# Patient Record
Sex: Female | Born: 1935 | Race: White | Hispanic: No | Smoking: Former smoker
Health system: Southern US, Community
[De-identification: ages and names within clinical notes are randomized; demographics above are authoritative.]

## PROBLEM LIST (undated history)

## (undated) DIAGNOSIS — M4310 Spondylolisthesis, site unspecified: Secondary | ICD-10-CM

## (undated) DIAGNOSIS — E785 Hyperlipidemia, unspecified: Secondary | ICD-10-CM

## (undated) DIAGNOSIS — E079 Disorder of thyroid, unspecified: Secondary | ICD-10-CM

## (undated) DIAGNOSIS — M810 Age-related osteoporosis without current pathological fracture: Secondary | ICD-10-CM

## (undated) DIAGNOSIS — M1990 Unspecified osteoarthritis, unspecified site: Secondary | ICD-10-CM

## (undated) DIAGNOSIS — I10 Essential (primary) hypertension: Secondary | ICD-10-CM

## (undated) DIAGNOSIS — F0390 Unspecified dementia without behavioral disturbance: Secondary | ICD-10-CM

## (undated) DIAGNOSIS — F419 Anxiety disorder, unspecified: Secondary | ICD-10-CM

## (undated) DIAGNOSIS — F32A Depression, unspecified: Secondary | ICD-10-CM

## (undated) DIAGNOSIS — J449 Chronic obstructive pulmonary disease, unspecified: Secondary | ICD-10-CM

## (undated) DIAGNOSIS — F329 Major depressive disorder, single episode, unspecified: Secondary | ICD-10-CM

## (undated) DIAGNOSIS — I639 Cerebral infarction, unspecified: Secondary | ICD-10-CM

## (undated) HISTORY — DX: Anxiety disorder, unspecified: F41.9

## (undated) HISTORY — DX: Essential (primary) hypertension: I10

## (undated) HISTORY — PX: THYROID SURGERY: SHX805

## (undated) HISTORY — DX: Depression, unspecified: F32.A

## (undated) HISTORY — DX: Age-related osteoporosis without current pathological fracture: M81.0

## (undated) HISTORY — DX: Disorder of thyroid, unspecified: E07.9

## (undated) HISTORY — DX: Hyperlipidemia, unspecified: E78.5

## (undated) HISTORY — DX: Unspecified osteoarthritis, unspecified site: M19.90

## (undated) HISTORY — PX: EYE SURGERY: SHX253

## (undated) HISTORY — PX: FOOT SURGERY: SHX648

## (undated) HISTORY — DX: Major depressive disorder, single episode, unspecified: F32.9

## (undated) HISTORY — DX: Chronic obstructive pulmonary disease, unspecified: J44.9

## (undated) HISTORY — PX: EXCISION OF ADNEXAL MASS: SHX5820

## (undated) HISTORY — DX: Spondylolisthesis, site unspecified: M43.10

---

## 1977-04-05 DIAGNOSIS — E079 Disorder of thyroid, unspecified: Secondary | ICD-10-CM

## 1977-04-05 HISTORY — PX: APPENDECTOMY: SHX54

## 1977-04-05 HISTORY — DX: Disorder of thyroid, unspecified: E07.9

## 1995-04-06 HISTORY — PX: ABDOMINAL HYSTERECTOMY: SHX81

## 2000-04-05 HISTORY — PX: CHOLECYSTECTOMY: SHX55

## 2004-02-24 ENCOUNTER — Ambulatory Visit: Payer: Self-pay | Admitting: Internal Medicine

## 2004-06-17 ENCOUNTER — Ambulatory Visit: Payer: Self-pay | Admitting: Gastroenterology

## 2004-07-15 ENCOUNTER — Ambulatory Visit: Payer: Self-pay | Admitting: Internal Medicine

## 2004-09-02 ENCOUNTER — Ambulatory Visit: Payer: Self-pay | Admitting: Internal Medicine

## 2005-03-10 ENCOUNTER — Ambulatory Visit: Payer: Self-pay | Admitting: Internal Medicine

## 2005-09-22 ENCOUNTER — Ambulatory Visit: Payer: Self-pay | Admitting: Internal Medicine

## 2005-11-17 ENCOUNTER — Other Ambulatory Visit: Payer: Self-pay

## 2005-12-14 ENCOUNTER — Ambulatory Visit: Payer: Self-pay | Admitting: Obstetrics and Gynecology

## 2005-12-15 ENCOUNTER — Ambulatory Visit: Payer: Self-pay | Admitting: Obstetrics and Gynecology

## 2005-12-20 ENCOUNTER — Ambulatory Visit: Payer: Self-pay | Admitting: Obstetrics and Gynecology

## 2006-03-15 ENCOUNTER — Ambulatory Visit: Payer: Self-pay | Admitting: Internal Medicine

## 2006-11-29 ENCOUNTER — Ambulatory Visit: Payer: Self-pay | Admitting: Specialist

## 2007-01-16 ENCOUNTER — Ambulatory Visit: Payer: Self-pay | Admitting: Podiatry

## 2007-01-18 ENCOUNTER — Ambulatory Visit: Payer: Self-pay | Admitting: Internal Medicine

## 2007-01-20 ENCOUNTER — Ambulatory Visit: Payer: Self-pay | Admitting: Podiatry

## 2007-06-18 LAB — HM COLONOSCOPY

## 2007-07-10 ENCOUNTER — Ambulatory Visit: Payer: Self-pay | Admitting: Internal Medicine

## 2007-07-11 ENCOUNTER — Ambulatory Visit: Payer: Self-pay | Admitting: Internal Medicine

## 2008-01-02 ENCOUNTER — Ambulatory Visit: Payer: Self-pay | Admitting: Internal Medicine

## 2008-03-11 ENCOUNTER — Ambulatory Visit: Payer: Self-pay | Admitting: Internal Medicine

## 2008-03-11 LAB — HM MAMMOGRAPHY

## 2011-02-16 ENCOUNTER — Ambulatory Visit: Payer: Self-pay | Admitting: Internal Medicine

## 2011-12-29 ENCOUNTER — Emergency Department: Payer: Self-pay | Admitting: Emergency Medicine

## 2012-02-09 ENCOUNTER — Telehealth: Payer: Self-pay | Admitting: Internal Medicine

## 2012-02-09 NOTE — Telephone Encounter (Signed)
If abdominal pain - I rec eval at acute care to confirm nothing acute going on.  To acute care today.

## 2012-02-09 NOTE — Telephone Encounter (Signed)
Pt's son called and is saying his mother is having really bad sharp pains in stomach///no vomiting. He wasn't sure what to do or what was going on.

## 2012-02-09 NOTE — Telephone Encounter (Signed)
Called patient at home, know one answered and voice mail was full. Will call patient back later.

## 2012-02-09 NOTE — Telephone Encounter (Signed)
Call back and spoke to pt son. He will seek care at acute. Will follow-up after.

## 2012-02-11 ENCOUNTER — Other Ambulatory Visit: Payer: Self-pay | Admitting: *Deleted

## 2012-02-21 ENCOUNTER — Other Ambulatory Visit: Payer: Self-pay | Admitting: *Deleted

## 2012-02-21 MED ORDER — PRAVASTATIN SODIUM 10 MG PO TABS: 10.0000 mg | ORAL_TABLET | Freq: Every day | ORAL | Status: DC

## 2012-03-31 ENCOUNTER — Ambulatory Visit: Payer: Self-pay | Admitting: Medical

## 2012-03-31 ENCOUNTER — Emergency Department: Payer: Self-pay | Admitting: Emergency Medicine

## 2012-05-22 ENCOUNTER — Ambulatory Visit: Payer: Self-pay | Admitting: Internal Medicine

## 2012-05-22 ENCOUNTER — Encounter: Payer: Self-pay | Admitting: *Deleted

## 2012-08-04 ENCOUNTER — Ambulatory Visit: Payer: Self-pay | Admitting: Physician Assistant

## 2012-10-09 ENCOUNTER — Observation Stay: Payer: Self-pay | Admitting: Internal Medicine

## 2012-10-09 LAB — CBC
HCT: 42.8 % (ref 35.0–47.0)
HGB: 14.3 g/dL (ref 12.0–16.0)
MCH: 32.3 pg (ref 26.0–34.0)
MCV: 96 fL (ref 80–100)

## 2012-10-09 LAB — COMPREHENSIVE METABOLIC PANEL
Alkaline Phosphatase: 72 U/L (ref 50–136)
Bilirubin,Total: 0.3 mg/dL (ref 0.2–1.0)
Calcium, Total: 8.6 mg/dL (ref 8.5–10.1)
Co2: 27 mmol/L (ref 21–32)
Creatinine: 0.84 mg/dL (ref 0.60–1.30)
EGFR (African American): >60

## 2012-10-09 LAB — TROPONIN I: Troponin-I: <0.02 ng/mL

## 2012-10-10 ENCOUNTER — Ambulatory Visit: Payer: Self-pay | Admitting: Neurology

## 2012-10-10 LAB — URINALYSIS, COMPLETE
Blood: NEGATIVE
Ketone: NEGATIVE
Protein: NEGATIVE
Specific Gravity: 1.012 (ref 1.003–1.030)
Squamous Epithelial: <1
WBC UR: 2 /HPF (ref 0–5)

## 2012-10-11 DIAGNOSIS — I359 Nonrheumatic aortic valve disorder, unspecified: Secondary | ICD-10-CM

## 2012-11-13 ENCOUNTER — Ambulatory Visit: Payer: Self-pay | Admitting: Internal Medicine

## 2013-01-10 ENCOUNTER — Ambulatory Visit: Payer: Self-pay | Admitting: Ophthalmology

## 2013-03-07 ENCOUNTER — Ambulatory Visit: Payer: Self-pay | Admitting: Ophthalmology

## 2013-12-28 ENCOUNTER — Ambulatory Visit: Payer: Self-pay | Admitting: Family Medicine

## 2014-04-24 DIAGNOSIS — M810 Age-related osteoporosis without current pathological fracture: Secondary | ICD-10-CM | POA: Diagnosis not present

## 2014-04-24 DIAGNOSIS — B351 Tinea unguium: Secondary | ICD-10-CM | POA: Diagnosis not present

## 2014-04-24 DIAGNOSIS — Z720 Tobacco use: Secondary | ICD-10-CM | POA: Diagnosis not present

## 2014-04-24 DIAGNOSIS — Z1239 Encounter for other screening for malignant neoplasm of breast: Secondary | ICD-10-CM | POA: Diagnosis not present

## 2014-04-24 DIAGNOSIS — F0390 Unspecified dementia without behavioral disturbance: Secondary | ICD-10-CM | POA: Diagnosis not present

## 2014-04-24 DIAGNOSIS — I10 Essential (primary) hypertension: Secondary | ICD-10-CM | POA: Diagnosis not present

## 2014-04-24 DIAGNOSIS — M79674 Pain in right toe(s): Secondary | ICD-10-CM | POA: Diagnosis not present

## 2014-04-24 DIAGNOSIS — J449 Chronic obstructive pulmonary disease, unspecified: Secondary | ICD-10-CM | POA: Diagnosis not present

## 2014-04-24 DIAGNOSIS — L97511 Non-pressure chronic ulcer of other part of right foot limited to breakdown of skin: Secondary | ICD-10-CM | POA: Diagnosis not present

## 2014-04-24 DIAGNOSIS — M79675 Pain in left toe(s): Secondary | ICD-10-CM | POA: Diagnosis not present

## 2014-04-24 DIAGNOSIS — M1990 Unspecified osteoarthritis, unspecified site: Secondary | ICD-10-CM | POA: Diagnosis not present

## 2014-06-27 DIAGNOSIS — G8929 Other chronic pain: Secondary | ICD-10-CM | POA: Diagnosis not present

## 2014-06-27 DIAGNOSIS — M65342 Trigger finger, left ring finger: Secondary | ICD-10-CM | POA: Diagnosis not present

## 2014-06-27 DIAGNOSIS — M79642 Pain in left hand: Secondary | ICD-10-CM | POA: Diagnosis not present

## 2014-07-26 NOTE — Discharge Summary (Signed)
PATIENT NAME:  Allison Sullivan, Chey M MR#:  [id number] DATE OF BIRTH:  1931-02-20  DATE OF ADMISSION:  10/09/2012 DATE OF DISCHARGE:  10/11/2012  PRIMARY CARE PHYSICIAN: Clydie Braunavid Fitzgerald, MD  CONSULTING NEUROLOGIST: Pauletta BrownsYuriy Zeylikman, MD     DISCHARGE DIAGNOSES:  1.  Transient ischemic attack. 2.  Hypertension.  3.  Hyperlipidemia.  4.  Dementia.    HISTORY OF PRESENT ILLNESS: This is an 81 year old female with a history of hypertension, hyperlipidemia and dementia who presented with blurred vision, severe weakness and right eye gaze palsy and generalized weakness. In the ED, she was noted to have a right eye abduction abnormality. CT scan of the head was negative. She was admitted for further workup.   HOSPITAL COURSE BY ISSUE:  1.  Eye palsy and generalized weakness as well as acute mental status changes:  This was felt to likely be an acute CVA with possible intranuclear ophthalmoplegia. However, she had an MRI done that was negative.  She also had echocardiograms, carotid Dopplers, all of which were unrevealing. She was started on aspirin and continued on a statin. Neurology did see her.  2.  Hypertension:  Blood pressure was well controlled.  3.  Hyperlipidemia:  The patient was discharged on a statin.   DISCHARGE MEDICATIONS: 1.  Vitamin B12 once a day.  2.  Calcium with vitamin D once a day.  3.  Simvastatin 40 mg once a day.  4.  Aspirin 81 mg once a day.  5.  Aricept 10 mg once a day.   DISPOSITION: Discharged to home with home health for physical therapy, nursing, social work.   DISCHARGE DIET: Low sodium, regular consistency.   ACTIVITY: As tolerated.   DISCHARGE FOLLOWUP:  The patient will follow up with Dr. Sampson GoonFitzgerald in 1 to 2 weeks.  TIME TAKEN:  This discharge took 35 minutes.   ____________________________ Stann Mainlandavid P. Sampson GoonFitzgerald, MD dpf:cb D: 10/18/2012 21:42:24 ET T: 10/18/2012 23:15:16 ET JOB#: [id number]  cc: Stann Mainlandavid P. Sampson GoonFitzgerald, MD, <Dictator> DAVID Sampson GoonFITZGERALD  MD ELECTRONICALLY SIGNED 10/24/2012 22:27

## 2014-07-26 NOTE — H&P (Signed)
PATIENT NAME:  Allison Sullivan, Allison Sullivan MR#:  [id number] DATE OF BIRTH:  May 07, 1930  DATE OF ADMISSION:  10/09/2012  PRIMARY CARE PHYSICIAN: Dr. Sampson Goon   CHIEF COMPLAINT: Blurred vision and right-sided eye deviation.   HISTORY OF PRESENTING ILLNESS: An 81 year old female patient with history of hypertension, hyperlipidemia, dementia, presents to the hospital brought in by the daughter after she noticed that while sitting on the porch the patient complained of a sudden onset of blurred vision. Later, she noticed that her right eye was deviated outside, with severe generalized weakness; she was unable to stand, and daughter brought her to the Emergency Room. Here, the patient's motor strength is 5 out of 5 all over but has right eye gaze palsy with the eye abducted outside. The patient does not complain of any diplopia. No blurred vision. No nausea, vomiting.  Complains of on and off headache which is not present at this time.   The patient has had recurrent episodes of presyncopal episodes in the past which were worked up in the past and nothing found. Today, there was no presyncope or fall, uses a cane for ambulation.   PAST MEDICAL HISTORY:  Hypertension, hyperlipidemia, COPD, dementia.   SOCIAL HISTORY: The patient continues to smoke. No alcohol. No illicit drugs.   CODE STATUS:  FULL CODE.    REVIEW OF SYSTEMS:  CONSTITUTIONAL: No fatigue, weakness.  EYES: No blurred vision, pain, redness.  Had blurred vision previously which has resolved.  ENT: No tinnitus, ear pain, hearing loss.  RESPIRATORY: No cough, wheeze, hemoptysis.  CARDIOVASCULAR: No chest pain, orthopnea, edema.  GASTROINTESTINAL: No nausea, vomiting, diarrhea, abdominal pain.  GENITOURINARY: No dysuria, hematuria, frequency.  ENDOCRINE: No polyuria, nocturia or thyroid problems.  HEMATOLOGIC/LYMPHATIC: No anemia, easy bruising, bleeding.  INTEGUMENTARY: No acne, rash, lesions.  MUSCULOSKELETAL: Has arthritis.  NEUROLOGICAL: Has  a right eye gaze palsy.  PSYCHIATRIC: No anxiety or depression.   FAMILY HISTORY: Reviewed, but unknown.   HOME MEDICATIONS: Calcium and vitamin D 1 tablet oral once a day, donepezil 5 mg 2 tablets oral once a day, vitamin B12 oral once a day.   ALLERGIES: FOSAMAX.   PHYSICAL EXAMINATION: VITAL SIGNS: Show a temperature of 98.1, pulse of 78, blood pressure 167/69, saturating 96% on room air.  GENERAL: Frail, elderly Caucasian female patient lying in bed, comfortable, conversational.  PSYCHIATRIC: Alert, oriented x 3. Mood and affect appropriate. Judgment intact, pleasant.  HEENT: Atraumatic, normocephalic. Oral mucosa moist and pink. External ears and nose normal. Right eye abducted laterally.  NECK: Supple. No thyromegaly. No palpable lymph nodes. Trachea midline. No carotid bruit, JVD.  CARDIOVASCULAR: S1, S2 without any murmurs. Peripheral pulses 2+. No edema.  RESPIRATORY: Normal work of breathing. Clear to auscultation on both sides.  GASTROINTESTINAL: Soft abdomen, nontender. Bowel sounds present. No hepatosplenomegaly palpable.  SKIN: Warm and dry. No petechiae, rash, ulcers.  MUSCULOSKELETAL: No joint swelling, redness, effusion of the large joints. Normal muscle tone.  NEUROLOGICAL: Motor strength 5 out of 5 in upper and lower extremities. Sensation to fine touch is intact all over.  Reflexes 2+.  LYMPHATIC: No cervical, supraclavicular lymphadenopathy.   LABORATORY AND RADIOLOGICAL DATA:  Glucose 165, BUN 13, creatinine 0.84, sodium 142, potassium 3.6. AST, ALT, alkaline phosphatase normal. Troponin less than 0.02. WBC 8.9, hemoglobin 14.3, platelets of 203.  CT scan of the head shows atrophy, microvascular changes  EKG shows normal sinus rhythm. Left anterior fascicular block.   ASSESSMENT AND PLAN: 1.  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 Will admit the patient onto a tele floor. We will  get an MRI of the brain, echo, carotids.  Start her on aspirin, statin.  Get a neurology consult. The patient does not have any problems with dysphagia or weakness. Will not need speech or PT. We will check a fasting lipid profile in the morning. The patient does have elevated blood pressure. Could be secondary to her acute stroke. The patient has history of hypertension per old records but presently not on blood pressure medications.  Can be restarted on blood pressure medications once we can confirm she has an acute CVA or not.  2.  Hypertension:  See above.  3.  Hyperlipidemia:  On statin.  4.  Dementia.  5.  Deep vein thrombosis prophylaxis with Lovenox.   CODE STATUS:  FULL CODE.     TIME SPENT TODAY ON THIS CASE: 60 minutes.  ____________________________ Molinda BailiffSrikar R. Anniah Glick, MD srs:cb D: 10/09/2012 19:49:50 ET T: 10/09/2012 20:03:40 ET JOB#: [id number]  cc: Wardell HeathSrikar R. Lisha Vitale, MD, <Dictator> Dr. Redmond SchoolFitzgerald Dayana Dalporto R Kaulana Brindle MD ELECTRONICALLY SIGNED 10/22/2012 23:35

## 2014-07-26 NOTE — Consult Note (Signed)
PATIENT NAME:  Allison Sullivan, GROSECLOSE MR#:  [id number] DATE OF BIRTH:  [birth year]  DATE OF CONSULTATION:  10/10/2012  CONSULTING PHYSICIAN:  Pauletta Browns, MD  REASON FOR CONSULTATION:  Right eye deviation and blurry vision.  Information is obtained from chart, the patient's daughter who is at bedside and the patient herself.   HISTORY OF PRESENT ILLNESS:  This is an 81 year old female with past medical history of hypertension, hyperlipidemia, dementia, presents to the hospital by the daughter after she noticed her mom complaining of blurry vision and difficulty moving her right eye.  Her right eye was described as being down and out, suspected to be consistent with third nerve palsy on the right.  The patient was complaining of blurry vision and could not focus on the TV. The patient's daughter also states the patient was swaying from side to side and had weakness in the lower extremities. It is unclear whether one leg was weaker than the other.  As per daughter, the patient has had numerous falls in the past, more so in the past few months.  Currently, the patient is back to her baseline, has no complaints.   PAST MEDICAL HISTORY:  Significant for hypertension, hyperlipidemia, COPD and dementia.   SOCIAL HISTORY:  The patient continues to smoke.  No alcohol use.  No illicit drug use.  CODE STATUS:  FULL.  REVIEW OF SYSTEMS:  EYES:  No blurred vision.  No pain.  No redness. Had some blurred vision, but has resolved.  ENT:  No tinnitus.  RESPIRATORY:  No cough.  No wheezing.  No hemoptysis.  CARDIOVASCULAR:  No chest pain, orthopnea or edema.  GASTROINTESTINAL:  No nausea.  No vomiting, diarrhea.  GENITOURINARY:  No dysuria or hematuria.  ENDOCRINE:  No polyuria or nocturia. HEMATOLOGIC:  No anemia or bruising complications.  NEUROLOGICAL:  Has history of dementia.   HOME MEDICATIONS:  Including calcium, donepezil, vitamin B12 complex.  IMAGING:  Status post MRI of the brain did not show any  acute intracranial pathology.  Carotid Doppler:  No evidence of hemodynamic stenosis.   VITAL SIGNS:  Include a temperature of 97.8, pulse 60, respirations 18, blood pressure 132/65.   PHYSICAL EXAMINATION:  The patient is alert to her name and to the type of facility it is. She states this is the hospital, could not tell me the name of the facility, could not tell me the date, time or the President of the [place]. Speech appears to be intact. No signs of dysarthria or aphasia.  CRANIAL NERVE EXAMINATION:  Pupils are equal, round and symmetrical, 4 mm, 2 mm reactive bilaterally. Visual fields appear to be intact. Pupils are midline. Facial sensation intact. Facial motor is intact. Tongue is midline. Uvula elevates symmetrically. Shoulder shrug intact. Motor 5 out of 5 bilateral upper and lower extremities.  Reflexes are 1+ throughout.  Coordination: Finger-to-nose intact.  Gait could not be assessed.   IMPRESSION: An 81 year old female with past medical history of hypertension, hyperlipidemia, presenting with sudden onset of blurred vision and what appears to be right third nerve palsy, as well as ataxic gait and generalized weakness in the lower extremities.  All these symptoms have resolved.   PLAN: Suspicion for TIA that is specifically to the brainstem, more or so of the midbrain.  MRI of the brain reviewed and carotid Doppler without any acute intracranial pathology and no hemodynamic stenosis. Would start patient on aspirin 325 daily, obtain 2D echocardiogram, have physical therapy and occupational therapy see  her.  The patient should also be sent for hemoglobin A1c,  lipid panel, statin should be started.  This case was discussed with daughter in detail. After the above workup is complete, I feel safe the patient can be discharged from a neurological standpoint if she is back to her baseline.  Thank you for allowing us to see this patient.   ____________________________ Pauletta BrownsYuriy  Haroun Cotham, MD yz:ce D: 10/10/2012 12:07:22 ET T: 10/10/2012 12:26:14 ET JOB#: [id number]  cc: Pauletta BrownsYuriy Mohsen Odenthal, MD, <Dictator> Pauletta BrownsYURIY Jaystin Mcgarvey MD ELECTRONICALLY SIGNED 11/13/2012 14:49

## 2014-08-23 DIAGNOSIS — M79674 Pain in right toe(s): Secondary | ICD-10-CM | POA: Diagnosis not present

## 2014-08-23 DIAGNOSIS — I10 Essential (primary) hypertension: Secondary | ICD-10-CM | POA: Diagnosis not present

## 2014-08-23 DIAGNOSIS — F0280 Dementia in other diseases classified elsewhere without behavioral disturbance: Secondary | ICD-10-CM | POA: Diagnosis not present

## 2014-08-23 DIAGNOSIS — G301 Alzheimer's disease with late onset: Secondary | ICD-10-CM | POA: Diagnosis not present

## 2014-08-23 DIAGNOSIS — M79675 Pain in left toe(s): Secondary | ICD-10-CM | POA: Diagnosis not present

## 2014-08-23 DIAGNOSIS — B351 Tinea unguium: Secondary | ICD-10-CM | POA: Diagnosis not present

## 2014-08-23 DIAGNOSIS — M65342 Trigger finger, left ring finger: Secondary | ICD-10-CM | POA: Diagnosis not present

## 2014-08-23 DIAGNOSIS — J449 Chronic obstructive pulmonary disease, unspecified: Secondary | ICD-10-CM | POA: Diagnosis not present

## 2014-09-11 ENCOUNTER — Ambulatory Visit: Payer: Commercial Managed Care - HMO | Admitting: Anesthesiology

## 2014-09-11 ENCOUNTER — Encounter
Admission: RE | Disposition: A | Discharge status: Home / Self Care | Payer: Self-pay | Source: Ambulatory Visit | Attending: Surgery

## 2014-09-11 ENCOUNTER — Encounter: Payer: Self-pay | Admitting: *Deleted

## 2014-09-11 ENCOUNTER — Ambulatory Visit
Admission: RE | Admit: 2014-09-11 | Discharge: 2014-09-11 | Disposition: A | Discharge status: Home / Self Care | Payer: Commercial Managed Care - HMO | Source: Ambulatory Visit | Attending: Surgery | Admitting: Surgery

## 2014-09-11 DIAGNOSIS — Z9049 Acquired absence of other specified parts of digestive tract: Secondary | ICD-10-CM | POA: Diagnosis not present

## 2014-09-11 DIAGNOSIS — F17210 Nicotine dependence, cigarettes, uncomplicated: Secondary | ICD-10-CM | POA: Diagnosis not present

## 2014-09-11 DIAGNOSIS — Z7951 Long term (current) use of inhaled steroids: Secondary | ICD-10-CM | POA: Insufficient documentation

## 2014-09-11 DIAGNOSIS — J449 Chronic obstructive pulmonary disease, unspecified: Secondary | ICD-10-CM | POA: Insufficient documentation

## 2014-09-11 DIAGNOSIS — F17200 Nicotine dependence, unspecified, uncomplicated: Secondary | ICD-10-CM | POA: Diagnosis not present

## 2014-09-11 DIAGNOSIS — Z79899 Other long term (current) drug therapy: Secondary | ICD-10-CM | POA: Diagnosis not present

## 2014-09-11 DIAGNOSIS — Z90710 Acquired absence of both cervix and uterus: Secondary | ICD-10-CM | POA: Diagnosis not present

## 2014-09-11 DIAGNOSIS — B351 Tinea unguium: Secondary | ICD-10-CM | POA: Insufficient documentation

## 2014-09-11 DIAGNOSIS — Z809 Family history of malignant neoplasm, unspecified: Secondary | ICD-10-CM | POA: Insufficient documentation

## 2014-09-11 DIAGNOSIS — Z7982 Long term (current) use of aspirin: Secondary | ICD-10-CM | POA: Diagnosis not present

## 2014-09-11 DIAGNOSIS — I10 Essential (primary) hypertension: Secondary | ICD-10-CM | POA: Diagnosis not present

## 2014-09-11 DIAGNOSIS — M65342 Trigger finger, left ring finger: Secondary | ICD-10-CM | POA: Diagnosis not present

## 2014-09-11 DIAGNOSIS — Z823 Family history of stroke: Secondary | ICD-10-CM | POA: Insufficient documentation

## 2014-09-11 DIAGNOSIS — E780 Pure hypercholesterolemia: Secondary | ICD-10-CM | POA: Diagnosis not present

## 2014-09-11 DIAGNOSIS — M810 Age-related osteoporosis without current pathological fracture: Secondary | ICD-10-CM | POA: Diagnosis not present

## 2014-09-11 DIAGNOSIS — Z8249 Family history of ischemic heart disease and other diseases of the circulatory system: Secondary | ICD-10-CM | POA: Diagnosis not present

## 2014-09-11 DIAGNOSIS — F0390 Unspecified dementia without behavioral disturbance: Secondary | ICD-10-CM | POA: Diagnosis not present

## 2014-09-11 HISTORY — DX: Unspecified dementia, unspecified severity, without behavioral disturbance, psychotic disturbance, mood disturbance, and anxiety: F03.90

## 2014-09-11 HISTORY — PX: TRIGGER FINGER RELEASE: SHX641

## 2014-09-11 HISTORY — DX: Cerebral infarction, unspecified: I63.9

## 2014-09-11 SURGERY — RELEASE, A1 PULLEY, FOR TRIGGER FINGER
Anesthesia: Monitor Anesthesia Care | Laterality: Left | Wound class: Clean

## 2014-09-11 MED ORDER — PROPOFOL INFUSION 10 MG/ML OPTIME
INTRAVENOUS | Status: DC | PRN
  Administered 2014-09-11: 50 ug/kg/min via INTRAVENOUS

## 2014-09-11 MED ORDER — BUPIVACAINE HCL (PF) 0.5 % IJ SOLN
INTRAMUSCULAR | Status: DC | PRN
  Administered 2014-09-11: 10 mL

## 2014-09-11 MED ORDER — HYDROCODONE-ACETAMINOPHEN 5-325 MG PO TABS: 1.0000 | ORAL_TABLET | ORAL | Status: DC | PRN

## 2014-09-11 MED ORDER — CEFAZOLIN SODIUM 1-5 GM-% IV SOLN
1.0000 g | Freq: Once | INTRAVENOUS | Status: AC
  Administered 2014-09-11: 1 g via INTRAVENOUS

## 2014-09-11 MED ORDER — LACTATED RINGERS IV SOLN
INTRAVENOUS | Status: DC
  Administered 2014-09-11: 13:00:00 via INTRAVENOUS

## 2014-09-11 MED ORDER — MIDAZOLAM HCL 5 MG/5ML IJ SOLN
INTRAMUSCULAR | Status: DC | PRN
  Administered 2014-09-11: 1 mg via INTRAVENOUS

## 2014-09-11 MED ORDER — FENTANYL CITRATE (PF) 100 MCG/2ML IJ SOLN
INTRAMUSCULAR | Status: DC | PRN
  Administered 2014-09-11: 50 ug via INTRAVENOUS

## 2014-09-11 SURGICAL SUPPLY — 29 items
BANDAGE ELASTIC 2 VELCRO NS LF (GAUZE/BANDAGES/DRESSINGS) IMPLANT
BANDAGE ELASTIC 3 VELCRO NS (GAUZE/BANDAGES/DRESSINGS) IMPLANT
BANDAGE ELASTIC 4 VELCRO NS (GAUZE/BANDAGES/DRESSINGS) IMPLANT
BNDG COHESIVE 4X5 TAN STRL (GAUZE/BANDAGES/DRESSINGS) ×3 IMPLANT
BNDG ESMARK 4X12 TAN STRL LF (GAUZE/BANDAGES/DRESSINGS) ×3 IMPLANT
CHLORAPREP W/TINT 26ML (MISCELLANEOUS) ×3 IMPLANT
CORD BIP STRL DISP 12FT (MISCELLANEOUS) ×3 IMPLANT
COVER LIGHT HANDLE FLEXIBLE (MISCELLANEOUS) ×3 IMPLANT
COVER LIGHT HANDLE UNIVERSAL (MISCELLANEOUS) ×6 IMPLANT
CUFF TOURN SGL QUICK 18 (TOURNIQUET CUFF) IMPLANT
CUFF TOURNIQUET DUAL PORT 18X3 (MISCELLANEOUS) ×3 IMPLANT
GAUZE PETRO XEROFOAM 1X8 (MISCELLANEOUS) ×3 IMPLANT
GAUZE SPONGE 4X4 12PLY STRL (GAUZE/BANDAGES/DRESSINGS) ×3 IMPLANT
GAUZE STRETCH 2X75IN STRL (MISCELLANEOUS) IMPLANT
GLOVE BIO SURGEON STRL SZ8 (GLOVE) ×6 IMPLANT
GLOVE INDICATOR 8.0 STRL GRN (GLOVE) ×3 IMPLANT
GOWN STRL REUS W/ TWL LRG LVL3 (GOWN DISPOSABLE) ×1 IMPLANT
GOWN STRL REUS W/ TWL XL LVL3 (GOWN DISPOSABLE) ×1 IMPLANT
GOWN STRL REUS W/TWL LRG LVL3 (GOWN DISPOSABLE) ×2
GOWN STRL REUS W/TWL XL LVL3 (GOWN DISPOSABLE) ×2
NS IRRIG 500ML POUR BTL (IV SOLUTION) ×3 IMPLANT
PACK EXTREMITY ARMC (MISCELLANEOUS) ×3 IMPLANT
PAD GROUND ADULT SPLIT (MISCELLANEOUS) ×3 IMPLANT
STOCKINETTE ×3 IMPLANT
STOCKINETTE IMPERVIOUS LG (DRAPES) ×3 IMPLANT
STRAP BODY AND KNEE 60X3 (MISCELLANEOUS) ×3 IMPLANT
SUT PROLENE 4 0 PS 2 18 (SUTURE) ×3 IMPLANT
SUT VIC AB 3-0 SH 27 (SUTURE)
SUT VIC AB 3-0 SH 27X BRD (SUTURE) IMPLANT

## 2014-09-11 NOTE — Anesthesia Preprocedure Evaluation (Addendum)
Anesthesia Evaluation  Patient identified by MRN, date of birth, ID band Patient awake    Reviewed: Allergy & Precautions, H&P , NPO status , Patient's Chart, lab work & pertinent test results, reviewed documented beta blocker date and time   Airway Mallampati: II  TM Distance: >3 FB Neck ROM: full    Dental no notable dental hx.    Pulmonary COPD COPD inhaler, Current Smoker,  breath sounds clear to auscultation  Pulmonary exam normal       Cardiovascular Exercise Tolerance: Good hypertension, Rhythm:regular Rate:Normal     Neuro/Psych PSYCHIATRIC DISORDERS CVA    GI/Hepatic negative GI ROS, Neg liver ROS,   Endo/Other  negative endocrine ROS  Renal/GU negative Renal ROS  negative genitourinary   Musculoskeletal   Abdominal   Peds  Hematology negative hematology ROS (+)   Anesthesia Other Findings   Reproductive/Obstetrics negative OB ROS                           Anesthesia Physical Anesthesia Plan  ASA: III  Anesthesia Plan: Bier Block and MAC   Post-op Pain Management:    Induction:   Airway Management Planned:   Additional Equipment:   Intra-op Plan:   Post-operative Plan:   Informed Consent: I have reviewed the patients History and Physical, chart, labs and discussed the procedure including the risks, benefits and alternatives for the proposed anesthesia with the patient or authorized representative who has indicated his/her understanding and acceptance.   Dental Advisory Given  Plan Discussed with: CRNA  Anesthesia Plan Comments:       Anesthesia Quick Evaluation

## 2014-09-11 NOTE — Op Note (Signed)
09/11/2014  1:55 PM  Patient:   Allison Sullivan  Pre-Op Diagnosis:   Left ring trigger finger  Post-Op Diagnosis:   Same  Procedure:   Release left ring trigger finger  Surgeon:   Maryagnes AmosJ. Jeffrey Poggi, MD  Assistant:   Manson PasseyMcGhee, PA-C  Anesthesia:   Bier block  Findings:   As above.  Complications:   None  EBL:   0 cc  Fluids:   550 cc crystalloid  TT:   38 minutes at 250 mmHg  Drains:   None  Closure:   4-0 Prolene  Implants:   None  Brief Clinical Note:   The patient is an 81 year old female with a history of progressively worsening pain and stiffness of her left ring finger. Her history and examination are consistent with a left ring trigger finger with the inability to extend her finger. She presents at this time for definitive management of her injury.  Procedure:   The patient was brought into the operating room and lain in the supine position. After adequate IV sedation was achieved, a Bier block was placed and the tourniquet inflated to 250 mmHg. The left hand and upper extremity were prepped with DuraPrep solution before being draped sterilely. Preoperative antibiotics were administered. The finger was gently extended and was able to be brought out to near 0. The patient was positioned in a lead hand before an approximately 1.5 cm was made transversely over the distal palmar crease, centered over the flexor tendon. The incision was carried down through the subcutaneous tissues with care taken to avoid damaging the adjacent neurovascular structures. The tendon sheath was identified and entered just proximal to the A1 pulley. Dissection was carried out proximally, releasing the tendon sheath which was quite thickened and stenotic. The underlying tendons demonstrated some tendinopathy changes consistent with chafing. Attention was directed distally. The A1 pulley was carefully released using a #15 blade after passing a hemostat beneath it. The A1 pulley was released in its entirety  and the underlying tendons assessed and found to be intact.   The wound was copiously irrigated with sterile saline solution before being closed using 4-0 proline interrupted sutures. A total of 10 cc of 0.5% plain Sensorcaine was injected in and around the incision to help with postoperative analgesia before a sterile bulky dressing was applied to the hand, incorporating a dorsal AlumaFoam splint which was placed over the dorsal aspect of the ring finger to maintain extension. The patient was then awakened and returned to the recovery room in satisfactory condition after tolerating the procedure well.

## 2014-09-11 NOTE — Transfer of Care (Signed)
Immediate Anesthesia Transfer of Care Note  Patient: Allison Sullivan  Procedure(s) Performed: Procedure(s) with comments: RELEASE TRIGGER FINGER/A-1 PULLEY (Left) - RISK FOR FALLS  Patient Location: PACU  Anesthesia Type: Bier Block, MAC  Level of Consciousness: awake, alert  and patient cooperative  Airway and Oxygen Therapy: Patient Spontanous Breathing and Patient connected to supplemental oxygen  Post-op Assessment: Post-op Vital signs reviewed, Patient's Cardiovascular Status Stable, Respiratory Function Stable, Patent Airway and No signs of Nausea or vomiting  Post-op Vital Signs: Reviewed and stable  Complications: No apparent anesthesia complications

## 2014-09-11 NOTE — H&P (Signed)
Paper H&P to be scanned into permanent record. H&P reviewed. No changes. 

## 2014-09-11 NOTE — Discharge Instructions (Signed)
General Anesthesia, Care After Refer to this sheet in the next few weeks. These instructions provide you with information on caring for yourself after your procedure. Your health care provider may also give you more specific instructions. Your treatment has been planned according to current medical practices, but problems sometimes occur. Call your health care provider if you have any problems or questions after your procedure. WHAT TO EXPECT AFTER THE PROCEDURE After the procedure, it is typical to experience:  Sleepiness.  Nausea and vomiting. HOME CARE INSTRUCTIONS  For the first 24 hours after general anesthesia:  Have a responsible person with you.  Do not drive a car. If you are alone, do not take public transportation.  Do not drink alcohol.  Do not take medicine that has not been prescribed by your health care provider.  Do not sign important papers or make important decisions.  You may resume a normal diet and activities as directed by your health care provider.  Change bandages (dressings) as directed.  If you have questions or problems that seem related to general anesthesia, call the hospital and ask for the anesthetist or anesthesiologist on call. SEEK MEDICAL CARE IF:  You have nausea and vomiting that continue the day after anesthesia.  You develop a rash. SEEK IMMEDIATE MEDICAL CARE IF:   You have difficulty breathing.  You have chest pain.  You have any allergic problems. Document Released: 06/28/2000 Document Revised: 03/27/2013 Document Reviewed: 10/05/2012 [facility] Patient Information 2015 [employer] [place]. This information is not intended to replace advice given to you by your health care provider. Make sure you discuss any questions you have with your health care provider.  Keep dressing dry and intact. Keep hand elevated above heart level. Apply ice to affected area frequently. Return for follow-up in 10-14 days or as scheduled.

## 2014-09-11 NOTE — Anesthesia Postprocedure Evaluation (Signed)
  Anesthesia Post-op Note  Patient: Allison Sullivan  Procedure(s) Performed: Procedure(s) with comments: RELEASE TRIGGER FINGER/A-1 PULLEY (Left) - RISK FOR FALLS  Anesthesia type:Bier Block, MAC  Patient location: PACU  Post pain: Pain level controlled  Post assessment: Post-op Vital signs reviewed, Patient's Cardiovascular Status Stable, Respiratory Function Stable, Patent Airway and No signs of Nausea or vomiting  Post vital signs: Reviewed and stable  Last Vitals:  Filed Vitals:   09/11/14 1400  BP: 126/51  Pulse: 52  Temp: 36.5 C  Resp: 15    Level of consciousness: awake, alert  and patient cooperative  Complications: No apparent anesthesia complications

## 2014-09-11 NOTE — Anesthesia Procedure Notes (Signed)
Anesthesia Regional Block:  Bier block (IV Regional)  Pre-Anesthetic Checklist: ,, timeout performed, Correct Patient, Correct Site, Correct Laterality, Correct Procedure, Correct Position, site marked, Risks and benefits discussed,  Surgical consent,  Pre-op evaluation,  At surgeon's request and post-op pain management  Laterality: Left     Needles:  Injection technique: Single-shot      Additional Needles: Bier block (IV Regional) Narrative:  Start time: 09/11/2014 1:13 PM End time: 09/11/2014 1:18 PM Injection made incrementally with aspirations every 30 mL. Anesthesiologist: Karren BurlyKOVACS, DANIEL D

## 2014-09-12 ENCOUNTER — Encounter: Payer: Self-pay | Admitting: Surgery

## 2014-11-25 DIAGNOSIS — I10 Essential (primary) hypertension: Secondary | ICD-10-CM | POA: Diagnosis not present

## 2014-11-25 DIAGNOSIS — E782 Mixed hyperlipidemia: Secondary | ICD-10-CM | POA: Diagnosis not present

## 2014-11-25 DIAGNOSIS — G301 Alzheimer's disease with late onset: Secondary | ICD-10-CM

## 2014-11-25 DIAGNOSIS — F0280 Dementia in other diseases classified elsewhere without behavioral disturbance: Secondary | ICD-10-CM | POA: Insufficient documentation

## 2014-11-25 DIAGNOSIS — M810 Age-related osteoporosis without current pathological fracture: Secondary | ICD-10-CM | POA: Diagnosis not present

## 2014-11-25 DIAGNOSIS — Z720 Tobacco use: Secondary | ICD-10-CM | POA: Diagnosis not present

## 2015-01-08 DIAGNOSIS — Z720 Tobacco use: Secondary | ICD-10-CM | POA: Diagnosis not present

## 2015-01-08 DIAGNOSIS — Z23 Encounter for immunization: Secondary | ICD-10-CM | POA: Diagnosis not present

## 2015-01-08 DIAGNOSIS — F0280 Dementia in other diseases classified elsewhere without behavioral disturbance: Secondary | ICD-10-CM | POA: Diagnosis not present

## 2015-01-08 DIAGNOSIS — E782 Mixed hyperlipidemia: Secondary | ICD-10-CM | POA: Diagnosis not present

## 2015-01-08 DIAGNOSIS — G301 Alzheimer's disease with late onset: Secondary | ICD-10-CM | POA: Diagnosis not present

## 2015-01-08 DIAGNOSIS — I10 Essential (primary) hypertension: Secondary | ICD-10-CM | POA: Diagnosis not present

## 2015-01-08 DIAGNOSIS — M810 Age-related osteoporosis without current pathological fracture: Secondary | ICD-10-CM | POA: Diagnosis not present

## 2015-01-08 DIAGNOSIS — J449 Chronic obstructive pulmonary disease, unspecified: Secondary | ICD-10-CM | POA: Diagnosis not present

## 2015-04-29 ENCOUNTER — Encounter: Payer: Self-pay | Admitting: Emergency Medicine

## 2015-04-29 ENCOUNTER — Ambulatory Visit
Admission: EM | Admit: 2015-04-29 | Discharge: 2015-04-29 | Disposition: A | Discharge status: Home / Self Care | Payer: Commercial Managed Care - HMO | Attending: Family Medicine | Admitting: Family Medicine

## 2015-04-29 DIAGNOSIS — S61411A Laceration without foreign body of right hand, initial encounter: Secondary | ICD-10-CM

## 2015-04-29 MED ORDER — TETANUS-DIPHTH-ACELL PERTUSSIS 5-2.5-18.5 LF-MCG/0.5 IM SUSP
0.5000 mL | Freq: Once | INTRAMUSCULAR | Status: AC
  Administered 2015-04-29: 0.5 mL via INTRAMUSCULAR

## 2015-04-29 MED ORDER — CEPHALEXIN 500 MG PO CAPS: 500.0000 mg | ORAL_CAPSULE | Freq: Three times a day (TID) | ORAL | Status: DC

## 2015-04-29 NOTE — Discharge Instructions (Signed)
Laceration Care, Adult °A laceration is a cut that goes through all layers of the skin. The cut also goes into the tissue that is right under the skin. Some cuts heal on their own. Others need to be closed with stitches (sutures), staples, skin adhesive strips, or wound glue. Taking care of your cut lowers your risk of infection and helps your cut to heal better. °HOW TO TAKE CARE OF YOUR CUT °For stitches or staples: °· Keep the wound clean and dry. °· If you were given a bandage (dressing), you should change it at least one time per day or as told by your doctor. You should also change it if it gets wet or dirty. °· Keep the wound completely dry for the first 24 hours or as told by your doctor. After that time, you may take a shower or a bath. However, make sure that the wound is not soaked in water until after the stitches or staples have been removed. °· Clean the wound one time each day or as told by your doctor: °· Wash the wound with soap and water. °· Rinse the wound with water until all of the soap comes off. °· Pat the wound dry with a clean towel. Do not rub the wound. °· After you clean the wound, put a thin layer of antibiotic ointment on it as told by your doctor. This ointment: °· Helps to prevent infection. °· Keeps the bandage from sticking to the wound. °· Have your stitches or staples removed as told by your doctor. °If your doctor used skin adhesive strips:  °· Keep the wound clean and dry. °· If you were given a bandage, you should change it at least one time per day or as told by your doctor. You should also change it if it gets dirty or wet. °· Do not get the skin adhesive strips wet. You can take a shower or a bath, but be careful to keep the wound dry. °· If the wound gets wet, pat it dry with a clean towel. Do not rub the wound. °· Skin adhesive strips fall off on their own. You can trim the strips as the wound heals. Do not remove any strips that are still stuck to the wound. They will  fall off after a while. °If your doctor used wound glue: °· Try to keep your wound dry, but you may briefly wet it in the shower or bath. Do not soak the wound in water, such as by swimming. °· After you take a shower or a bath, gently pat the wound dry with a clean towel. Do not rub the wound. °· Do not do any activities that will make you really sweaty until the skin glue has fallen off on its own. °· Do not apply liquid, cream, or ointment medicine to your wound while the skin glue is still on. °· If you were given a bandage, you should change it at least one time per day or as told by your doctor. You should also change it if it gets dirty or wet. °· If a bandage is placed over the wound, do not let the tape for the bandage touch the skin glue. °· Do not pick at the glue. The skin glue usually stays on for 5-10 days. Then, it falls off of the skin. °General Instructions  °· To help prevent scarring, make sure to cover your wound with sunscreen whenever you are outside after stitches are removed, after adhesive strips are removed,   or when wound glue stays in place and the wound is healed. Make sure to wear a sunscreen of at least 30 SPF. °· Take over-the-counter and prescription medicines only as told by your doctor. °· If you were given antibiotic medicine or ointment, take or apply it as told by your doctor. Do not stop using the antibiotic even if your wound is getting better. °· Do not scratch or pick at the wound. °· Keep all follow-up visits as told by your doctor. This is important. °· Check your wound every day for signs of infection. Watch for: °¨ Redness, swelling, or pain. °¨ Fluid, blood, or pus. °· Raise (elevate) the injured area above the level of your heart while you are sitting or lying down, if possible. °GET HELP IF: °· You got a tetanus shot and you have any of these problems at the injection site: °¨ Swelling. °¨ Very bad pain. °¨ Redness. °¨ Bleeding. °· You have a fever. °· A wound that was  closed breaks open. °· You notice a bad smell coming from your wound or your bandage. °· You notice something coming out of the wound, such as wood or glass. °· Medicine does not help your pain. °· You have more redness, swelling, or pain at the site of your wound. °· You have fluid, blood, or pus coming from your wound. °· You notice a change in the color of your skin near your wound. °· You need to change the bandage often because fluid, blood, or pus is coming from the wound. °· You start to have a new rash. °· You start to have numbness around the wound. °GET HELP RIGHT AWAY IF: °· You have very bad swelling around the wound. °· Your pain suddenly gets worse and is very bad. °· You notice painful lumps near the wound or on skin that is anywhere on your body. °· You have a red streak going away from your wound. °· The wound is on your hand or foot and you cannot move a finger or toe like you usually can. °· The wound is on your hand or foot and you notice that your fingers or toes look pale or bluish. °  °This information is not intended to replace advice given to you by your health care provider. Make sure you discuss any questions you have with your health care provider. °  °Document Released: 09/08/2007 Document Revised: 08/06/2014 Document Reviewed: 03/18/2014 °Elsevier Interactive Patient Education ©2016 [employer] ° °Stitches, Staples, or Adhesive Wound Closure °Doctors use stitches (sutures), staples, and certain glue (skin adhesives) to hold your skin together while it heals (wound closure). You may need this treatment after you have surgery or if you cut your skin accidentally. These methods help your skin heal more quickly. They also make it less likely that you will have a scar. °WHAT ARE THE DIFFERENT KINDS OF WOUND CLOSURES? °There are many options for wound closure. The one that your doctor uses depends on how deep and large your wound is. °Adhesive Glue °To use this glue to close a wound, your  doctor holds the edges of the wound together and paints the glue on the surface of your skin. You may need more than one layer of glue. Then the wound may be covered with a light bandage (dressing). °This type of skin closure may be used for small wounds that are not deep (superficial). Using glue for wound closure is less painful than other methods. It does not require   a medicine that numbs the area. This method also leaves nothing to be removed. Adhesive glue is often used for children and on facial wounds. °Adhesive glue cannot be used for wounds that are deep, uneven, or bleeding. It is not used inside of a wound.  °Adhesive Strips °These strips are made of sticky (adhesive), porous paper. They are placed across your skin edges like a regular adhesive bandage. You leave them on until they fall off. °Adhesive strips may be used to close very superficial wounds. They may also be used along with sutures to improve closure of your skin edges.  °Sutures °Sutures are the oldest method of wound closure. Sutures can be made from natural or synthetic materials. They can be made from a material that your body can break down as your wound heals (absorbable), or they can be made from a material that needs to be removed from your skin (nonabsorbable). They come in many different strengths and sizes. °Your doctor attaches the sutures to a steel needle on one end. Sutures can be passed through your skin, or through the tissues beneath your skin. Then they are tied and cut. Your skin edges may be closed in one continuous stitch or in separate stitches. °Sutures are strong and can be used for all kinds of wounds. Absorbable sutures may be used to close tissues under the skin. The disadvantage of sutures is that they may cause skin reactions that lead to infection. Nonabsorbable sutures need to be removed. °Staples °When surgical staples are used to close a wound, the edges of your skin on both sides of the wound are brought  close together. A staple is placed across the wound, and an instrument secures the edges together. Staples are often used to close surgical cuts (incisions). °Staples are faster to use than sutures, and they cause less reaction from your skin. Staples need to be removed using a tool that bends the staples away from your skin. °HOW DO I CARE FOR MY WOUND CLOSURE? °· Take medicines only as told by your doctor. °· If you were prescribed an antibiotic medicine for your wound, finish it all even if you start to feel better. °· Use ointments or creams only as told by your doctor. °· Wash your hands with soap and water before and after touching your wound. °· Do not soak your wound in water. Do not take baths, swim, or use a hot tub until your doctor says it is okay. °· Ask your doctor when you can start showering. Cover your wound if told by your doctor. °· Do not take out your own sutures or staples. °· Do not pick at your wound. Picking can cause an infection. °· Keep all follow-up visits as told by your doctor. This is important. °HOW LONG WILL I HAVE MY WOUND CLOSURE?  °· Leave adhesive glue on your skin until the glue peels away. °· Leave adhesive strips on your skin until they fall off. °· Absorbable sutures will dissolve within several days. °· Nonabsorbable sutures and staples must be removed. The location of the wound will determine how long they stay in. This can range from several days to a couple of weeks. °WHEN SHOULD I SEEK HELP FOR MY WOUND CLOSURE? °Contact your doctor if: °· You have a fever. °· You have chills. °· You have redness, puffiness (swelling), or pain at the site of your wound. °· You have fluid, blood, or pus coming from your wound. °· There is a bad smell coming   from your wound. °· The skin edges of your wound start to separate after your sutures have been removed. °· Your wound becomes thick, raised, and darker in color after your sutures come out (scarring). °  °This information is not  intended to replace advice given to you by your health care provider. Make sure you discuss any questions you have with your health care provider. °  °Document Released: 01/17/2009 Document Revised: 04/12/2014 Document Reviewed: 08/29/2013 °Elsevier Interactive Patient Education ©2016 [employer] ° °

## 2015-04-29 NOTE — ED Provider Notes (Signed)
CSN: [id number]     Arrival date & time 04/29/15  1018 History   First MD Initiated Contact with Patient 04/29/15 1059    Nurses notes were reviewed. Chief Complaint  Patient presents with  . Laceration   (Consider location/radiation/quality/duration/timing/severity/associated sxs/prior Treatment) Patient is a 82 y.o. female presenting with skin laceration. The history is provided by the patient and a relative. No language interpreter was used.  Laceration Location:  Hand Hand laceration location:  Dorsum of R hand Depth:  Through dermis Bleeding: venous   Time since incident:  15 hours Injury mechanism: Overweight [place] ran over the dorsum of her hand. Pain details:    Quality:  Burning   Severity:  Moderate Foreign body present:  No foreign bodies Relieved by:  Certain positions Worsened by:  Nothing tried Ineffective treatments:  Certain positions Tetanus status:  Unknown   Past Medical History  Diagnosis Date  . Hypertension   . Hyperlipidemia   . Degenerative joint disease     degerative disc disease  . Osteoporosis   . Spondylolisthesis     with foraminal stenosis  . Thyroid disease 1979    Thyroid lesion  . Stroke [facility])     mini stroke-2 yrs ago  . COPD (chronic obstructive pulmonary disease) (HCC)     Documented  . Dementia    Past Surgical History  Procedure Laterality Date  . Abdominal hysterectomy  1997  . Appendectomy  1979  . Cholecystectomy  2002  . Excision of adnexal mass Left   . Foot surgery Right   . Thyroid surgery      thyroid lesion resection  . Eye surgery Bilateral     Cataract  . Trigger finger release Left 09/11/2014    Procedure: RELEASE TRIGGER FINGER/A-1 PULLEY;  Surgeon: Christena Flake, MD;  Location: [facility];  Service: Orthopedics;  Laterality: Left;  RISK FOR FALLS   Family History  Problem Relation Age of Onset  . Heart disease Mother 66    died of attack  . Arthritis Mother   . Hyperlipidemia Mother   . Heart  disease Father 23    died of heart attack   Social History  Substance Use Topics  . Smoking status: Current Every Day Smoker -- 1.00 packs/day for 60 years    Types: Cigarettes  . Smokeless tobacco: None  . Alcohol Use: No   OB History    No data available     Review of Systems  Allergies  Review of patient's allergies indicates no known allergies.  Home Medications   Prior to Admission medications   Medication Sig Start Date End Date Taking? Authorizing Provider  aspirin 81 MG tablet Take 81 mg by mouth daily. am    Historical Provider, MD  Calcium Carbonate-Vitamin D (CALTRATE 600+D PO) Take 1 tablet by mouth 2 (two) times daily. sporatic    Historical Provider, MD  cephALEXin (KEFLEX) 500 MG capsule Take 1 capsule (500 mg total) by mouth 3 (three) times daily. 04/29/15   Hassan Rowan, MD  HYDROcodone-acetaminophen (NORCO) 5-325 MG per tablet Take 1 tablet by mouth every 4 (four) hours as needed for moderate pain. MAXIMUM TOTAL ACETAMINOPHEN DOSE IS 4000 MG PER DAY 09/11/14   Christena Flake, MD  sertraline (ZOLOFT) 50 MG tablet Take 50 mg by mouth daily. Takes 1/2 tablet by mouth each day/am    Historical Provider, MD   Meds Ordered and Administered this Visit   Medications  Tdap (BOOSTRIX) injection 0.5  mL (0.5 mLs Intramuscular Given 04/29/15 1056)    BP 116/61 mmHg  Pulse 74  Temp(Src) 97.3 F (36.3 C) (Tympanic)  Resp 16  Ht  (1.499 m)  Wt 100 lb (45.36 kg)  BMI 20.19 kg/m2  SpO2 98% No data found.   Physical Exam  Constitutional: She is oriented to person, place, and time. She appears well-developed. She does not have a sickly appearance.  Elderly white female  HENT:  Head: Normocephalic and atraumatic.  Eyes: Conjunctivae are normal. Pupils are equal, round, and reactive to light.  Musculoskeletal: She exhibits tenderness.       Arms:      Right hand: She exhibits tenderness.       Hands: Patient had 2 lacerations involving the right wrist and right  forearm together the 2 lacerations because he characterizes between a normal laceration and a skin tear. Total surface area with the court about 8 cm there was a flap on both lacerations  Neurological: She is alert and oriented to person, place, and time.  Skin: Skin is warm.  Psychiatric: She has a normal mood and affect. Her behavior is normal.  Vitals reviewed.   ED Course  .Marland KitchenLaceration Repair Date/Time: 04/29/2015 11:33 AM Performed by: Hassan Rowan Authorized by: Hassan Rowan Consent: Verbal consent obtained. Patient identity confirmed: verbally with patient Body area: upper extremity Location details: right hand Laceration length: 9 cm Tendon involvement: superficial Nerve involvement: none Vascular damage: no Patient sedated: no Amount of cleaning: standard (Hibiclens) Skin closure: glue Technique: simple Dressing: 4x4 sterile gauze Comments: Due to the age of the wound and the nature the wound the skin was basically unraveled for it retracted and on both wound sites stretch to make good approximation of wound closure and then using Dermabond after all bleeding was stopped was was placed. Good wound closure was obtained in both lacerations and patient taught procedure well.   (including critical care time)  Labs Review Labs Reviewed - No data to display  Imaging Review No results found.   Visual Acuity Review  Right Eye Distance:   Left Eye Distance:   Bilateral Distance:    Right Eye Near:   Left Eye Near:    Bilateral Near:         MDM   1. Laceration of hand, right, initial encounter    We'll place her on Keflex 500 mg 3 times a day since there was a dog claws caused the wound. Tetanus was given. Will have her return on Friday for reevaluation.    Hassan Rowan, MD 04/29/15 1137

## 2015-04-29 NOTE — ED Notes (Addendum)
Patient has lacerations across her right hand.  Patient states that her daughter's dog ran across her right hand while sitting on the sofa last night and scratched her right hand with his feet.  Patient continues to have some bleeding from her lacerations.

## 2015-10-08 ENCOUNTER — Observation Stay
Admission: EM | Admit: 2015-10-08 | Discharge: 2015-10-10 | Disposition: A | Discharge status: Skilled Nursing Facility | Payer: Commercial Managed Care - HMO | Attending: Internal Medicine | Admitting: Internal Medicine

## 2015-10-08 ENCOUNTER — Emergency Department: Payer: Commercial Managed Care - HMO

## 2015-10-08 ENCOUNTER — Encounter: Payer: Self-pay | Admitting: Emergency Medicine

## 2015-10-08 DIAGNOSIS — Z9842 Cataract extraction status, left eye: Secondary | ICD-10-CM | POA: Insufficient documentation

## 2015-10-08 DIAGNOSIS — I700 Atherosclerosis of aorta: Secondary | ICD-10-CM | POA: Diagnosis not present

## 2015-10-08 DIAGNOSIS — M1990 Unspecified osteoarthritis, unspecified site: Secondary | ICD-10-CM | POA: Insufficient documentation

## 2015-10-08 DIAGNOSIS — M25559 Pain in unspecified hip: Secondary | ICD-10-CM | POA: Diagnosis present

## 2015-10-08 DIAGNOSIS — Z8261 Family history of arthritis: Secondary | ICD-10-CM | POA: Insufficient documentation

## 2015-10-08 DIAGNOSIS — Z90710 Acquired absence of both cervix and uterus: Secondary | ICD-10-CM | POA: Diagnosis not present

## 2015-10-08 DIAGNOSIS — J449 Chronic obstructive pulmonary disease, unspecified: Secondary | ICD-10-CM | POA: Diagnosis not present

## 2015-10-08 DIAGNOSIS — M25551 Pain in right hip: Secondary | ICD-10-CM | POA: Diagnosis not present

## 2015-10-08 DIAGNOSIS — E079 Disorder of thyroid, unspecified: Secondary | ICD-10-CM | POA: Insufficient documentation

## 2015-10-08 DIAGNOSIS — Z8673 Personal history of transient ischemic attack (TIA), and cerebral infarction without residual deficits: Secondary | ICD-10-CM | POA: Insufficient documentation

## 2015-10-08 DIAGNOSIS — Z9181 History of falling: Secondary | ICD-10-CM | POA: Insufficient documentation

## 2015-10-08 DIAGNOSIS — S79911A Unspecified injury of right hip, initial encounter: Secondary | ICD-10-CM | POA: Diagnosis not present

## 2015-10-08 DIAGNOSIS — F0280 Dementia in other diseases classified elsewhere without behavioral disturbance: Secondary | ICD-10-CM | POA: Insufficient documentation

## 2015-10-08 DIAGNOSIS — F0390 Unspecified dementia without behavioral disturbance: Secondary | ICD-10-CM | POA: Diagnosis not present

## 2015-10-08 DIAGNOSIS — F17210 Nicotine dependence, cigarettes, uncomplicated: Secondary | ICD-10-CM | POA: Diagnosis not present

## 2015-10-08 DIAGNOSIS — R262 Difficulty in walking, not elsewhere classified: Secondary | ICD-10-CM | POA: Diagnosis not present

## 2015-10-08 DIAGNOSIS — I10 Essential (primary) hypertension: Secondary | ICD-10-CM | POA: Diagnosis not present

## 2015-10-08 DIAGNOSIS — Z9049 Acquired absence of other specified parts of digestive tract: Secondary | ICD-10-CM | POA: Insufficient documentation

## 2015-10-08 DIAGNOSIS — Z9841 Cataract extraction status, right eye: Secondary | ICD-10-CM | POA: Diagnosis not present

## 2015-10-08 DIAGNOSIS — Z79899 Other long term (current) drug therapy: Secondary | ICD-10-CM | POA: Diagnosis not present

## 2015-10-08 DIAGNOSIS — S299XXA Unspecified injury of thorax, initial encounter: Secondary | ICD-10-CM | POA: Diagnosis not present

## 2015-10-08 DIAGNOSIS — W19XXXA Unspecified fall, initial encounter: Secondary | ICD-10-CM | POA: Diagnosis not present

## 2015-10-08 DIAGNOSIS — E785 Hyperlipidemia, unspecified: Secondary | ICD-10-CM | POA: Diagnosis not present

## 2015-10-08 DIAGNOSIS — M810 Age-related osteoporosis without current pathological fracture: Secondary | ICD-10-CM | POA: Diagnosis not present

## 2015-10-08 DIAGNOSIS — Z7982 Long term (current) use of aspirin: Secondary | ICD-10-CM | POA: Insufficient documentation

## 2015-10-08 DIAGNOSIS — Z8249 Family history of ischemic heart disease and other diseases of the circulatory system: Secondary | ICD-10-CM | POA: Insufficient documentation

## 2015-10-08 DIAGNOSIS — M546 Pain in thoracic spine: Secondary | ICD-10-CM | POA: Diagnosis not present

## 2015-10-08 LAB — CBC WITH DIFFERENTIAL/PLATELET
BASOS ABS: 0.1 10*3/uL (ref 0–0.1)
Basophils Relative: 1 %
Eosinophils Absolute: 0.2 10*3/uL (ref 0–0.7)
Eosinophils Relative: 2 %
HEMATOCRIT: 41.8 % (ref 35.0–47.0)
HEMOGLOBIN: 14.3 g/dL (ref 12.0–16.0)
LYMPHS PCT: 16 %
Lymphs Abs: 1.5 10*3/uL (ref 1.0–3.6)
MCH: 32.4 pg (ref 26.0–34.0)
MCHC: 34.2 g/dL (ref 32.0–36.0)
MCV: 94.6 fL (ref 80.0–100.0)
MONO ABS: 0.8 10*3/uL (ref 0.2–0.9)
Monocytes Relative: 9 %
Neutro Abs: 6.6 10*3/uL — ABNORMAL HIGH (ref 1.4–6.5)
Neutrophils Relative %: 72 %
Platelets: 153 10*3/uL (ref 150–440)
RBC: 4.42 MIL/uL (ref 3.80–5.20)
RDW: 13.2 % (ref 11.5–14.5)
WBC: 9.2 10*3/uL (ref 3.6–11.0)

## 2015-10-08 LAB — URINALYSIS COMPLETE WITH MICROSCOPIC (ARMC ONLY)
BACTERIA UA: NONE SEEN
Bilirubin Urine: NEGATIVE
Glucose, UA: NEGATIVE mg/dL
HGB URINE DIPSTICK: NEGATIVE
LEUKOCYTES UA: NEGATIVE
NITRITE: NEGATIVE
PROTEIN: 30 mg/dL — AB
SPECIFIC GRAVITY, URINE: 1.019 (ref 1.005–1.030)
Squamous Epithelial / LPF: NONE SEEN
pH: 6 (ref 5.0–8.0)

## 2015-10-08 LAB — BASIC METABOLIC PANEL
ANION GAP: 8 (ref 5–15)
BUN: 25 mg/dL — ABNORMAL HIGH (ref 6–20)
CHLORIDE: 111 mmol/L (ref 101–111)
CO2: 26 mmol/L (ref 22–32)
Calcium: 8.7 mg/dL — ABNORMAL LOW (ref 8.9–10.3)
Creatinine, Ser: 0.57 mg/dL (ref 0.44–1.00)
GFR calc Af Amer: >60 mL/min (ref >60)
GFR calc non Af Amer: >60 mL/min (ref >60)
GLUCOSE: 93 mg/dL (ref 65–99)
POTASSIUM: 3.5 mmol/L (ref 3.5–5.1)
Sodium: 145 mmol/L (ref 135–145)

## 2015-10-08 MED ORDER — LIDOCAINE 5 % EX PTCH
1.0000 | MEDICATED_PATCH | CUTANEOUS | Status: DC
  Administered 2015-10-08 – 2015-10-10 (×3): 1 via TRANSDERMAL
  Filled 2015-10-08 (×3): qty 1

## 2015-10-08 MED ORDER — MORPHINE SULFATE (PF) 2 MG/ML IV SOLN: 1.0000 mg | INTRAVENOUS | Status: DC | PRN

## 2015-10-08 MED ORDER — ACETAMINOPHEN 325 MG PO TABS: 650.0000 mg | ORAL_TABLET | Freq: Four times a day (QID) | ORAL | Status: DC | PRN

## 2015-10-08 MED ORDER — OXYCODONE HCL 5 MG PO TABS
5.0000 mg | ORAL_TABLET | ORAL | Status: DC | PRN
  Administered 2015-10-08 – 2015-10-09 (×3): 5 mg via ORAL
  Filled 2015-10-08 (×3): qty 1

## 2015-10-08 MED ORDER — ASPIRIN EC 81 MG PO TBEC
81.0000 mg | DELAYED_RELEASE_TABLET | Freq: Every day | ORAL | Status: DC
  Administered 2015-10-08 – 2015-10-10 (×2): 81 mg via ORAL
  Filled 2015-10-08 (×3): qty 1

## 2015-10-08 MED ORDER — ACETAMINOPHEN 650 MG RE SUPP: 650.0000 mg | Freq: Four times a day (QID) | RECTAL | Status: DC | PRN

## 2015-10-08 MED ORDER — ONDANSETRON HCL 4 MG PO TABS: 4.0000 mg | ORAL_TABLET | Freq: Four times a day (QID) | ORAL | Status: DC | PRN

## 2015-10-08 MED ORDER — ACETAMINOPHEN 500 MG PO TABS
1000.0000 mg | ORAL_TABLET | Freq: Once | ORAL | Status: AC
  Administered 2015-10-08: 1000 mg via ORAL
  Filled 2015-10-08: qty 2

## 2015-10-08 MED ORDER — ENOXAPARIN SODIUM 40 MG/0.4ML ~~LOC~~ SOLN: 40.0000 mg | SUBCUTANEOUS | Status: DC

## 2015-10-08 MED ORDER — FENTANYL CITRATE (PF) 100 MCG/2ML IJ SOLN
25.0000 ug | Freq: Once | INTRAMUSCULAR | Status: AC
  Administered 2015-10-08: 25 ug via INTRAVENOUS
  Filled 2015-10-08: qty 2

## 2015-10-08 MED ORDER — CALCIUM CARBONATE-VITAMIN D 500-200 MG-UNIT PO TABS
1.0000 | ORAL_TABLET | Freq: Two times a day (BID) | ORAL | Status: DC
  Administered 2015-10-08 – 2015-10-10 (×2): 1 via ORAL
  Filled 2015-10-08 (×3): qty 1

## 2015-10-08 MED ORDER — ONDANSETRON HCL 4 MG/2ML IJ SOLN: 4.0000 mg | Freq: Four times a day (QID) | INTRAMUSCULAR | Status: DC | PRN

## 2015-10-08 MED ORDER — OXYCODONE HCL 5 MG PO TABS
5.0000 mg | ORAL_TABLET | Freq: Once | ORAL | Status: DC
  Filled 2015-10-08: qty 1

## 2015-10-08 NOTE — ED Notes (Signed)
Patient transported to X-ray 

## 2015-10-08 NOTE — Progress Notes (Addendum)
Patient was admitted to room 152 from ER.  Right skin tear to right elbow. Patient is alert to self only. Bed alarm on for safety. [facility] No family at bedside, only parts of the admission was completed.

## 2015-10-08 NOTE — H&P (Addendum)
[facility] [place] [facility]   PATIENT NAME: Allison ChouMarie Vanlue    MR#:  [id number]  DATE OF BIRTH:  [birth year]  DATE OF ADMISSION:  10/08/2015  PRIMARY CARE PHYSICIAN: Patrice ParadiseMCLAUGHLIN, MIRIAM K, MD   REQUESTING/REFERRING PHYSICIAN: Dr. Don PerkingVeronese.   CHIEF COMPLAINT:   Chief Complaint  Patient presents with  . Fall  Right hip pain  HISTORY OF PRESENT ILLNESS:  Allison Sullivan  is a 82 y.o. female with a known history of Retention, hyperlipidemia, osteoarthritis, osteoporosis, history of previous TIAs, COPD, dementia presents to the hospital after a fall a few days back and having significant right hip pain. Patient had a fall this past Monday and has had significant difficulty bearing weight on the right hip and ambulating. Patient does have underlying dementia and uses a cane to ambulate but over the past 2 days she has not even able to bear any weight on that right leg. Family brought to the hospital for further evaluation. Patient underwent a CT scan of her hip along with x-rays of her pelvis and thoracic spine showing no evidence of acute abnormality. She was given some fentanyl and oxycodone for pain control and despite that her pain is not improved and she is not able to ambulate. Hospitalist services were contacted further treatment and evaluation.  PAST MEDICAL HISTORY:   Past Medical History  Diagnosis Date  . Hypertension   . Hyperlipidemia   . Degenerative joint disease     degerative disc disease  . Osteoporosis   . Spondylolisthesis     with foraminal stenosis  . Thyroid disease 1979    Thyroid lesion  . Stroke [facility](HCC)     mini stroke-2 yrs ago  . COPD (chronic obstructive pulmonary disease) (HCC)     Documented  . Dementia     PAST SURGICAL HISTORY:   Past Surgical History  Procedure Laterality Date  . Abdominal hysterectomy  1997  . Appendectomy  1979  . Cholecystectomy  2002  . Excision of adnexal mass Left   . Foot surgery Right   . Thyroid  surgery      thyroid lesion resection  . Eye surgery Bilateral     Cataract  . Trigger finger release Left 09/11/2014    Procedure: RELEASE TRIGGER FINGER/A-1 PULLEY;  Surgeon: Christena FlakeJohn J Poggi, MD;  [facility];  Service: Orthopedics;  Laterality: Left;  RISK FOR FALLS    SOCIAL HISTORY:   Social History  Substance Use Topics  . Smoking status: Current Every Day Smoker -- 1.00 packs/day for 60 years    Types: Cigarettes  . Smokeless tobacco: Not on file  . Alcohol Use: No    FAMILY HISTORY:   Family History  Problem Relation Age of Onset  . Heart disease Mother 1585    died of attack  . Arthritis Mother   . Hyperlipidemia Mother   . Heart disease Father 2564    died of heart attack    DRUG ALLERGIES:  No Known Allergies  REVIEW OF SYSTEMS:   Review of Systems  Constitutional: Negative for fever and weight loss.  HENT: Negative for congestion, nosebleeds and tinnitus.   Eyes: Negative for blurred vision, double vision and redness.  Respiratory: Negative for cough, hemoptysis and shortness of breath.   Cardiovascular: Negative for chest pain, orthopnea, leg swelling and PND.  Gastrointestinal: Negative for nausea, vomiting, abdominal pain, diarrhea and melena.  Genitourinary: Negative for dysuria, urgency and hematuria.  Musculoskeletal: Positive for joint pain (  right hip and pelvis) and falls.  Neurological: Negative for dizziness, tingling, sensory change, focal weakness, seizures, weakness and headaches.  Endo/Heme/Allergies: Negative for polydipsia. Does not bruise/bleed easily.  Psychiatric/Behavioral: Negative for depression and memory loss. The patient is not nervous/anxious.     MEDICATIONS AT HOME:   Prior to Admission medications   Medication Sig Start Date End Date Taking? Authorizing Provider  aspirin 81 MG tablet Take 81 mg by mouth daily. am   Yes Historical Provider, MD  Calcium Carbonate-Vitamin D (CALTRATE 600+D PO) Take 1 tablet by mouth  2 (two) times daily. sporatic   Yes Historical Provider, MD      VITAL SIGNS:  Blood pressure 181/62, pulse 77, temperature 97.8 F (36.6 C), temperature source Oral, resp. rate 14, SpO2 100 %.  PHYSICAL EXAMINATION:  Physical Exam  GENERAL:  82 y.o.-year-old patient lying in the bed in no acute distress.  EYES: Pupils equal, round, reactive to light and accommodation. No scleral icterus. Extraocular muscles intact.  HEENT: Head atraumatic, normocephalic. Oropharynx and nasopharynx clear. No oropharyngeal erythema, moist oral mucosa  NECK:  Supple, no jugular venous distention. No thyroid enlargement, no tenderness.  LUNGS: Normal breath sounds bilaterally, no wheezing, rales, rhonchi. No use of accessory muscles of respiration.  CARDIOVASCULAR: S1, S2 RRR. No murmurs, rubs, gallops, clicks.  ABDOMEN: Soft, nontender, nondistended. Bowel sounds present. No organomegaly or mass.  EXTREMITIES: No pedal edema, cyanosis, or clubbing. + 2 pedal & radial pulses b/l.  Significant right hip pain on adduction of the right hip.  NEUROLOGIC: Cranial nerves II through XII are intact. No focal Motor or sensory deficits appreciated b/l. Globally weak.   PSYCHIATRIC: The patient is alert and oriented x 2. Good affect.  SKIN: No obvious rash, lesion, or ulcer.   LABORATORY PANEL:   CBC  Recent Labs Lab 10/08/15 1610  WBC 9.2  HGB 14.3  HCT 41.8  PLT 153   ------------------------------------------------------------------------------------------------------------------  Chemistries   Recent Labs Lab 10/08/15 1610  NA 145  K 3.5  CL 111  CO2 26  GLUCOSE 93  BUN 25*  CREATININE 0.57  CALCIUM 8.7*   ------------------------------------------------------------------------------------------------------------------  Cardiac Enzymes No results for input(s): TROPONINI in the last 168  hours. ------------------------------------------------------------------------------------------------------------------  RADIOLOGY:  Dg Thoracic Spine 2 View  10/08/2015  CLINICAL DATA:  Fall right groin pain.  Mid back pain. EXAM: THORACIC SPINE 2 VIEWS COMPARISON:  10/09/2012 FINDINGS: Diffuse osteopenia. No fracture or malalignment. Diffuse aortic calcifications. IMPRESSION: No acute bony abnormality. Aortic atherosclerosis. Electronically Signed   By: Charlett Nose M.D.   On: 10/08/2015 14:14   Ct Hip Right Wo Contrast  10/08/2015  CLINICAL DATA:  Status post fall.  Right hip pain. EXAM: CT OF THE RIGHT HIP WITHOUT CONTRAST TECHNIQUE: Multidetector CT imaging of the right hip was performed according to the standard protocol. Multiplanar CT image reconstructions were also generated. COMPARISON:  None. FINDINGS: Bones/Joint/Cartilage No aggressive lytic or sclerotic osseous lesion. Generalized osteopenia. No acute fracture or dislocation. Normal alignment. Mild degenerative changes of the visualize right SI joint. Muscles Normal. Soft tissue No fluid collection or hematoma. No soft tissue mass. Soft tissue contusion posteriorly overlying the right greater trochanter. Peripheral vascular atherosclerotic disease. IMPRESSION: 1.  No acute osseous injury of the right hip. Electronically Signed   By: Elige Ko   On: 10/08/2015 16:26   Dg Hip Unilat With Pelvis 2-3 Views Right  10/08/2015  CLINICAL DATA:  Fall Monday. EXAM: DG HIP (WITH OR WITHOUT PELVIS) 2-3V  RIGHT COMPARISON:  None. FINDINGS: Mild degenerative changes in the hips bilaterally. Diffuse osteopenia. No acute bony abnormality. Specifically, no fracture, subluxation, or dislocation. Soft tissues are intact. Aortoiliac calcifications noted. IMPRESSION: No acute bony abnormality. Aortoiliac atherosclerosis. Electronically Signed   By: Charlett NoseKevin  Dover M.D.   On: 10/08/2015 14:15     IMPRESSION AND PLAN:   82 year old female with past medical  history of hypertension, dementia, osteoarthritis, osteoporosis who presented to the hospital after a fall and noted to have significant right hip pain.  1. Right hip pain-likely related to her recent fall and also a contusion. No evidence of acute osseous abnormality noted on the CT scan and x-ray. -We'll admit patient to the hospital started on IV morphine, oxycodone for pain control. - Get a physical therapy evaluation. If needed consider orthopedics evaluation for possible injection. -We'll get a case management consult for home health needs.  2. Dementia - pt. Has been declining and requiring more help with her ADL's.  - PCP as outpatient has been working on placement.   3. Hx of TIA's - cont. ASA.   All the records are reviewed and case discussed with ED provider. Management plans discussed with the patient, family and they are in agreement.  CODE STATUS: Full  TOTAL TIME TAKING CARE OF THIS PATIENT: 40 minutes.    Houston SirenSAINANI,Aalina Brege J M.D on 10/08/2015 at 5:43 PM  Between 7am to 6pm - Pager - [phone]  After 6pm go to [email or web address] - password [email or web address] [facility]  [facility]  Office  [phone]  CC: Primary care physician; Patrice ParadiseMCLAUGHLIN, MIRIAM K, MD

## 2015-10-08 NOTE — ED Notes (Addendum)
Pt from home via EMS , family called out for decreased ambulation after pt had fall Monday. Pt has hx of dementia, poor historian. Pt family at bedside states pt has been c/o groin pain to RT side. Family denies any LOC with fall.

## 2015-10-08 NOTE — ED Provider Notes (Signed)
[facility] Emergency Department Provider Note  ____________________________________________  Time seen: Approximately 1:33 PM  I have reviewed the triage vital signs and the nursing notes.   HISTORY  Chief Complaint Fall   HPI Allison Sullivan is a 82 y.o. female history of osteoporosis, advanced dementia, DJD, hypertension, and COPD who presents for evaluation of right hip pain. History is limited due to patient's dementia. Patient suffered a fall 2 days ago and since then has been less mobile at home. Today refuse ambulating. She was complaining to her family members of pain in her right hip. According to the family patient did not have LOC with the fall. Patient initially had no complaints however with internal and external rotation of her right hip patient was complaining of pain in her right hip. She reports that the pain was moderate, worse with movement, located in the lateral aspect of her proximal femur. Patient denies headache, back pain, abdominal pain, chest pain, shortness breath, dysuria, fever, pain in her extremities.  Past Medical History  Diagnosis Date  . Hypertension   . Hyperlipidemia   . Degenerative joint disease     degerative disc disease  . Osteoporosis   . Spondylolisthesis     with foraminal stenosis  . Thyroid disease 1979    Thyroid lesion  . Stroke [facility])     mini stroke-2 yrs ago  . COPD (chronic obstructive pulmonary disease) (HCC)     Documented  . Dementia     There are no active problems to display for this patient.   Past Surgical History  Procedure Laterality Date  . Abdominal hysterectomy  1997  . Appendectomy  1979  . Cholecystectomy  2002  . Excision of adnexal mass Left   . Foot surgery Right   . Thyroid surgery      thyroid lesion resection  . Eye surgery Bilateral     Cataract  . Trigger finger release Left 09/11/2014    Procedure: RELEASE TRIGGER FINGER/A-1 PULLEY;  Surgeon: Christena Flake, MD;   [facility];  Service: Orthopedics;  Laterality: Left;  RISK FOR FALLS    Current Outpatient Rx  Name  Route  Sig  Dispense  Refill  . aspirin 81 MG tablet   Oral   Take 81 mg by mouth daily. am         . Calcium Carbonate-Vitamin D (CALTRATE 600+D PO)   Oral   Take 1 tablet by mouth 2 (two) times daily. sporatic         . cephALEXin (KEFLEX) 500 MG capsule   Oral   Take 1 capsule (500 mg total) by mouth 3 (three) times daily.   21 capsule   0   . HYDROcodone-acetaminophen (NORCO) 5-325 MG per tablet   Oral   Take 1 tablet by mouth every 4 (four) hours as needed for moderate pain. MAXIMUM TOTAL ACETAMINOPHEN DOSE IS 4000 MG PER DAY   40 tablet   0   . sertraline (ZOLOFT) 50 MG tablet   Oral   Take 50 mg by mouth daily. Takes 1/2 tablet by mouth each day/am           Allergies Review of patient's allergies indicates no known allergies.  Family History  Problem Relation Age of Onset  . Heart disease Mother 22    died of attack  . Arthritis Mother   . Hyperlipidemia Mother   . Heart disease Father 67    died of heart  attack    Social History Social History  Substance Use Topics  . Smoking status: Current Every Day Smoker -- 1.00 packs/day for 60 years    Types: Cigarettes  . Smokeless tobacco: None  . Alcohol Use: No    Review of Systems Constitutional: Negative for fever. Eyes: Negative for visual changes. ENT: Negative for sore throat. Cardiovascular: Negative for chest pain. Respiratory: Negative for shortness of breath. Gastrointestinal: Negative for abdominal pain, vomiting or diarrhea. Genitourinary: Negative for dysuria. Musculoskeletal: Negative for back pain. + R hip pain Skin: Negative for rash. Neurological: Negative for headaches, weakness or numbness.  ____________________________________________   PHYSICAL EXAM:  VITAL SIGNS: ED Triage Vitals  Enc Vitals Group     BP 10/08/15 1310 181/62 mmHg     Pulse Rate  10/08/15 1310 77     Resp 10/08/15 1310 14     Temp 10/08/15 1310 97.8 F (36.6 C)     Temp Source 10/08/15 1310 Oral     SpO2 10/08/15 1310 100 %     Weight --      Height --      Head Cir --      Peak Flow --      Pain Score --      Pain Loc --      Pain Edu? --      Excl. in GC? --     Constitutional: Alert and oriented. Well appearing and in no apparent distress. HEENT:      Head: Normocephalic and atraumatic.         Eyes: Conjunctivae are normal. Sclera is non-icteric. EOMI. PERRL      Mouth/Throat: Mucous membranes are moist.       Neck: Supple with no signs of meningismus. Cardiovascular: Regular rate and rhythm. No murmurs, gallops, or rubs. 2+ symmetrical distal pulses are present in all extremities. No JVD. Respiratory: Normal respiratory effort. Lungs are clear to auscultation bilaterally. No wheezes, crackles, or rhonchi.  Gastrointestinal: Soft, non tender, and non distended with positive bowel sounds. No rebound or guarding. Musculoskeletal: Tenderness to palpation with internal and external rotation of the right hip, pelvis stable, full painless range of motion of the left hip, mild mid T-spine tenderness but no step-offs, no C-spine tenderness, no L-spine tenderness. Nontender with normal range of motion in all extremities. No edema, cyanosis, or erythema of extremities. Neurologic: Normal speech and language. Face is symmetric. Moving all extremities. No gross focal neurologic deficits are appreciated. Skin: Skin is warm, dry and intact. No rash noted. Psychiatric: Mood and affect are normal. Speech and behavior are normal.  ____________________________________________   LABS (all labs ordered are listed, but only abnormal results are displayed)  Labs Reviewed  CBC WITH DIFFERENTIAL/PLATELET - Abnormal; Notable for the following:    Neutro Abs 6.6 (*)    All other components within normal limits  URINALYSIS COMPLETEWITH MICROSCOPIC ([facility] ONLY)  BASIC  METABOLIC PANEL   ____________________________________________  EKG  ED ECG REPORT I, Nita Sicklearolina Emani Taussig, the attending physician, personally viewed and interpreted this ECG.  Normal sinus rhythm, rate of 78, normal intervals, left axis deviation, no ST elevations or depressions, T-wave inversions on aVL and V2. Unchanged from prior ____________________________________________  RADIOLOGY  I, Nita Sicklearolina Mayo Faulk, personally viewed and evaluated these images (plain radiographs) as part of my medical decision making, as well as reviewing the written report by the radiologist.  ____________________________________________   PROCEDURES  Procedure(s) performed: None Critical Care performed:  None ____________________________________________   INITIAL  IMPRESSION / ASSESSMENT AND PLAN / ED COURSE  82 y.o. female history of osteoporosis, advanced dementia, DJD, hypertension, and COPD who presents for evaluation of right hip pain status post mechanical fall 2 days ago. On exam patient is well-appearing, she has tenderness to palpation with internal and external rotation of her right hip, pelvis stable, no pain with tenderness of the hip joint, patient also complained of mild thoracic paraspinal tenderness however because of her history of osteoporosis will pursue the x-ray of the thoracic spine. We'll also get x-rays of her hip and femur on the right side. We'll treat the patient with 1000 mg of Tylenol.  ----------------------------------------- 4:38 PM on 10/08/2015 -----------------------------------------  ED course; XR negative. Patient unable to walk. CT negative for fracture. Patient unable to walk in spite of pain medication. We'll admit to hospitalist.  Pertinent labs & imaging results that were available during my care of the patient were reviewed by me and considered in my medical decision making (see chart for details).    ____________________________________________   FINAL  CLINICAL IMPRESSION(S) / ED DIAGNOSES  Final diagnoses:  Right hip pain      NEW MEDICATIONS STARTED DURING THIS VISIT:  New Prescriptions   No medications on file     Note:  This document was prepared using Dragon voice recognition software and may include unintentional dictation errors.    Nita Sicklearolina Matelyn Antonelli, MD 10/08/15 2012

## 2015-10-09 DIAGNOSIS — F0390 Unspecified dementia without behavioral disturbance: Secondary | ICD-10-CM | POA: Diagnosis not present

## 2015-10-09 DIAGNOSIS — M25551 Pain in right hip: Secondary | ICD-10-CM | POA: Diagnosis not present

## 2015-10-09 DIAGNOSIS — Z8673 Personal history of transient ischemic attack (TIA), and cerebral infarction without residual deficits: Secondary | ICD-10-CM | POA: Diagnosis not present

## 2015-10-09 DIAGNOSIS — M810 Age-related osteoporosis without current pathological fracture: Secondary | ICD-10-CM | POA: Diagnosis not present

## 2015-10-09 MED ORDER — TRAMADOL HCL 50 MG PO TABS
50.0000 mg | ORAL_TABLET | Freq: Four times a day (QID) | ORAL | Status: DC | PRN
  Administered 2015-10-09: 50 mg via ORAL
  Filled 2015-10-09: qty 1

## 2015-10-09 MED ORDER — ENOXAPARIN SODIUM 30 MG/0.3ML ~~LOC~~ SOLN
30.0000 mg | SUBCUTANEOUS | Status: DC
  Administered 2015-10-10: 30 mg via SUBCUTANEOUS
  Filled 2015-10-09: qty 0.3

## 2015-10-09 MED ORDER — TRAMADOL HCL 50 MG PO TABS: 50.0000 mg | ORAL_TABLET | Freq: Four times a day (QID) | ORAL | Status: DC | PRN

## 2015-10-09 MED ORDER — AMLODIPINE BESYLATE 5 MG PO TABS
5.0000 mg | ORAL_TABLET | Freq: Every day | ORAL | Status: DC
  Administered 2015-10-09 – 2015-10-10 (×2): 5 mg via ORAL
  Filled 2015-10-09 (×2): qty 1

## 2015-10-09 NOTE — [place] FL2 (Signed)
[place] MEDICAID FL2 LEVEL OF CARE SCREENING TOOL     IDENTIFICATION  Patient Name: Allison Sullivan Birthdate: [birth year] Sex: female Admission Date (Current Location): 10/08/2015  [place]   Facility and Address:  [facility], [address], [place], [place] [address]      Provider Number: [id number]  Attending Physician Name and Address:  Enid Baasadhika Kalisetti, MD  Relative Name and Phone Number:       Current Level of Care: Hospital Recommended Level of Care: Skilled Nursing Facility Prior Approval Number:    Date Approved/Denied:   PASRR Number:  [id number])  Discharge Plan: SNF    Current Diagnoses: Patient Active Problem List   Diagnosis Date Noted  . Hip pain 10/08/2015    Orientation RESPIRATION BLADDER Height & Weight     Self  Normal Continent Weight: 99 lb 1.6 oz (44.951 kg) Height:  5' (152.4 cm)  BEHAVIORAL SYMPTOMS/MOOD NEUROLOGICAL BOWEL NUTRITION STATUS   (none )  (none ) Continent Diet (Diet: Regular )  AMBULATORY STATUS COMMUNICATION OF NEEDS Skin   Extensive Assist Verbally Normal                       Personal Care Assistance Level of Assistance  Bathing, Feeding, Dressing Bathing Assistance: Limited assistance Feeding assistance: Independent Dressing Assistance: Limited assistance     Functional Limitations Info  Sight, Hearing, Speech Sight Info: Adequate Hearing Info: Adequate Speech Info: Adequate    SPECIAL CARE FACTORS FREQUENCY  PT (By licensed PT), OT (By licensed OT)     PT Frequency:  (5) OT Frequency:  (5)            Contractures      Additional Factors Info  Code Status, Allergies Code Status Info:  (Full Code. ) Allergies Info:  (No Known Allergies. )           Current Medications (10/09/2015):  This is the current hospital active medication list Current Facility-Administered Medications  Medication Dose Route Frequency Provider Last Rate Last  Dose  . acetaminophen (TYLENOL) tablet 650 mg  650 mg Oral Q6H PRN Houston SirenVivek J Sainani, MD       Or  . acetaminophen (TYLENOL) suppository 650 mg  650 mg Rectal Q6H PRN Houston SirenVivek J Sainani, MD      . amLODipine (NORVASC) tablet 5 mg  5 mg Oral Daily Enid Baasadhika Kalisetti, MD   5 mg at 10/09/15 1156  . aspirin EC tablet 81 mg  81 mg Oral Daily Houston SirenVivek J Sainani, MD   81 mg at 10/08/15 1830  . calcium-vitamin D (OSCAL WITH D) 500-200 MG-UNIT per tablet 1 tablet  1 tablet Oral BID Houston SirenVivek J Sainani, MD   1 tablet at 10/08/15 2042  . [START ON 10/10/2015] enoxaparin (LOVENOX) injection 30 mg  30 mg Subcutaneous Q24H Cy BlamerAllison K Tharakan, RPH      . lidocaine (LIDODERM) 5 % 1 patch  1 patch Transdermal Q24H Nita Sicklearolina Veronese, MD   1 patch at 10/09/15 1025  . morphine 2 MG/ML injection 1 mg  1 mg Intravenous Q3H PRN Houston SirenVivek J Sainani, MD      . ondansetron (ZOFRAN) tablet 4 mg  4 mg Oral Q6H PRN Houston SirenVivek J Sainani, MD       Or  . ondansetron (ZOFRAN) injection 4 mg  4 mg Intravenous Q6H PRN Houston SirenVivek J Sainani, MD      . traMADol Janean Sark(ULTRAM) tablet 50 mg  50 mg  Oral Q6H PRN Enid Baasadhika Kalisetti, MD         Discharge Medications: Please see discharge summary for a list of discharge medications.  Relevant Imaging Results:  Relevant Lab Results:   Additional Information  (SSN: [id number])  Haig ProphetMorgan, Ashlen Kiger G, LCSW

## 2015-10-09 NOTE — Progress Notes (Signed)
Physical Therapy Evaluation Patient Details Name: Allison MediciMarie D Sullivan MRN: [id number] DOB: 06-28-1930 Today's Date: 10/09/2015   History of Present Illness  Pt is an 82 yr old female presenting with R hip pain and inability to ambulate following a fall on Monday. Imaging came back unremarkable. PMH significant for advanced dementia, hypertension, hyperlipidemia, osteoarthritis, osteoporosis, history of previous TIAs, and COPD.     Clinical Impression  Prior to admission, pt was mod I with mobility but required assist from family for ADLs.  Pt lives in a one-level home with her daughter with 4-5 STE.  Currently, pt is min guard for bed mobility, and mod A with RW for sit <> stand transfers and ambulation x 303ft.  Pt demonstrates a strong posterior lean (suspect d/t increased pain with weight through RLE), mod A x1 and second assist for safety. Pt would benefit from skilled PT to address noted impairments and functional limitations.  Recommend pt discharge to SNF when medically appropriate.     Follow Up Recommendations SNF    Equipment Recommendations  Rolling walker with 5" wheels    Recommendations for Other Services       Precautions / Restrictions Precautions Precautions: Fall Restrictions Weight Bearing Restrictions: No      Mobility  Bed Mobility Overal bed mobility: Needs Assistance Bed Mobility: Supine to Sit     Supine to sit: Min guard;HOB elevated     General bed mobility comments: With HOB elevated, pt achieves sitting EOB with vc's and min guard for safety. Increased time required.  Transfers Overall transfer level: Needs assistance Equipment used: Rolling walker (2 wheeled) Transfers: Sit to/from Stand Sit to Stand: Mod assist         General transfer comment: Verbal and tactile cues for foot/hand placement, DME use, and sequencing. Mod physical assist required d/t strong posterior lean. Increased time required.  Ambulation/Gait Ambulation/Gait assistance: Mod  assist;+2 safety/equipment Ambulation Distance (Feet): 3 Feet Assistive device: Rolling walker (2 wheeled) Gait Pattern/deviations: Leaning posteriorly;Decreased stance time - right;Antalgic Gait velocity: decreased   General Gait Details: Strong posterior lean persists requiring mod A x1 to prevent LOB and second assist for safety. Vc's for DME use and sequencing. Increased time required.  Stairs    Wheelchair Mobility    Modified Rankin (Stroke Patients Only)       Balance Overall balance assessment: Needs assistance Sitting-balance support: Bilateral upper extremity supported;Feet supported Sitting balance-Leahy Scale: Good   Postural control: Posterior lean Standing balance support: Bilateral upper extremity supported (on RW) Standing balance-Leahy Scale: Poor Standing balance comment: Mod A to prevent posterior LOB      Pertinent Vitals/Pain Pain Assessment: Faces Faces Pain Scale: Hurts little more Pain Location: R LE (distal quad during TherEx, up into groin with standing) Pain Descriptors / Indicators: Sore Pain Intervention(s): Limited activity within patient's tolerance;Monitored during session;Repositioned (RN notified)  HR and O2 monitored throughout session and maintained WFL.    Home Living Family/patient expects to be discharged to:: Private residence Living Arrangements: Children Available Help at Discharge: Family Type of Home: House Home Access: Stairs to enter Entrance Stairs-Rails:  (unilateral) Entrance Stairs-Number of Steps: 5 Home Layout: One level Home Equipment: Gilmer Morane - single point Additional Comments: Information provided by pt's daughter, [name], as pt is a poor historian d/t dementia. [name] reports the pt is home alone for brief periods of time while [name] runs errands.    Prior Function Level of Independence: Independent with assistive device(s);Needs assistance   Gait / Transfers Assistance  Needed: Mod I with single point cane  ADL's /  Homemaking Assistance Needed: Pt's daughter assists with bathing, grooming, dressing, meal prep. Pt is able to feed herself.         Extremity/Trunk Assessment   Upper Extremity Assessment: Overall WFL for tasks assessed   Difficult to assess due to impaired cognition (Strength at least 3/5 throughout; light touch sensation in tact bilat)  Cervical / Trunk Assessment: Normal    Communication   Communication: No difficulties  Cognition Arousal/Alertness: Awake/alert Behavior During Therapy: WFL for tasks assessed/performed Overall Cognitive Status: History of cognitive impairments - at baseline (Oriented to self only; unable to identify place, time, or situation)    General Comments General comments (skin integrity, edema, etc.): skin tear noted R elbow; RN notified and to rebandage    Nursing cleared pt for participation in physical therapy.  Pt agreeable to PT session.     Exercises General Exercises - Lower Extremity Ankle Circles/Pumps: AROM Quad Sets: AROM Short Arc Quad: AROM Heel Slides: AROM Hip ABduction/ADduction: AROM Straight Leg Raises: AROM  All exercises performed bilaterally x 10 reps in supine.      Assessment/Plan    PT Assessment Patient needs continued PT services  PT Diagnosis Difficulty walking;Generalized weakness   PT Problem List Decreased strength;Decreased activity tolerance;Decreased balance;Decreased mobility;Decreased knowledge of use of DME;Decreased safety awareness  PT Treatment Interventions DME instruction;Gait training;Stair training;Functional mobility training;Therapeutic activities;Therapeutic exercise;Balance training;Patient/family education   PT Goals (Current goals can be found in the Care Plan section) Acute Rehab PT Goals Patient Stated Goal: To walk PT Goal Formulation: With patient Time For Goal Achievement: 10/23/15 Potential to Achieve Goals: Good    Frequency Min 2X/week   Barriers to discharge Inaccessible home  environment; Assist levels         End of Session Equipment Utilized During Treatment: Gait belt Activity Tolerance: Patient limited by pain Patient left: in chair;with call bell/phone within reach;with chair alarm set;with family/visitor present Nurse Communication: Mobility status;Precautions; RLE pain; R elbow tear         Time: [id number] PT Time Calculation (min) (ACUTE ONLY): 35 min   Charges:         PT G Codes:        Monie Shere, SPT 10/09/2015, 12:56 PM

## 2015-10-09 NOTE — Care Management Obs Status (Signed)
MEDICARE OBSERVATION STATUS NOTIFICATION   Patient Details  Name: Allison Sullivan MRN: [id number] Date of Birth: [birth year]   Medicare Observation Status Notification Given:  Yes    Adonis HugueninBerkhead, Fedra Lanter L, RN 10/09/2015, 2:21 PM

## 2015-10-09 NOTE — Progress Notes (Signed)
Patient is alert to self only. Impulsive. Spent time at the nurses' station this PM. Bed/chair alarm on for safety. Incont of urine, at times. Skin tear to left elbow, dressing intact.  Took meds without difficulty.

## 2015-10-09 NOTE — Progress Notes (Signed)
Pt has orders for enoxaparin 40mg  SQ q24hrs.   Due to patients weight of 45kg, will decrease dose to enoxaparin 30mg  SQ q24hrs.  Pharmacy will continue to monitor.   Roque CashAllison Ritchie Klee, PharmD Clinical Pharmacist  10/09/2015

## 2015-10-09 NOTE — Clinical Social Work Placement (Signed)
   CLINICAL SOCIAL WORK PLACEMENT  NOTE  Date:  10/09/2015  Patient Details  Name: Allison Sullivan MRN: [id number] Date of Birth: Apr 18, 1930  Clinical Social Work is seeking post-discharge placement for this patient at the Skilled  Nursing Facility level of care (*CSW will initial, date and re-position this form in  chart as items are completed):  Yes   Patient/family provided with [place] Clinical Social Work Department's list of facilities offering this level of care within the geographic area requested by the patient (or if unable, by the patient's family).  Yes   Patient/family informed of their freedom to choose among providers that offer the needed level of care, that participate in Medicare, Medicaid or managed care program needed by the patient, have an available bed and are willing to accept the patient.  Yes   Patient/family informed of [place]'s ownership interest in [facility] and [facility], as well as of the fact that they are under no obligation to receive care at these facilities.  PASRR submitted to EDS on 10/09/15     PASRR number received on 10/09/15     Existing PASRR number confirmed on       FL2 transmitted to all facilities in geographic area requested by pt/family on 10/09/15     FL2 transmitted to all facilities within larger geographic area on       Patient informed that his/her managed care company has contracts with or will negotiate with certain facilities, including the following:            Patient/family informed of bed offers received.  Patient chooses bed at       Physician recommends and patient chooses bed at      Patient to be transferred to   on  .  Patient to be transferred to facility by       Patient family notified on   of transfer.  Name of family member notified:        PHYSICIAN       Additional Comment:    _______________________________________________ Haig ProphetMorgan, Raeleen Winstanley G, LCSW 10/09/2015, 2:05 PM

## 2015-10-09 NOTE — Progress Notes (Signed)
[facility] [place] [facility]   PATIENT NAME: Allison ChouMarie Sullivan    MR#:  [id number]  DATE OF BIRTH:  [birth year]  SUBJECTIVE:  CHIEF COMPLAINT:   Chief Complaint  Patient presents with  . Fall   -Patient with dementia from home, admitted after fall and right hip pain. CT with no fracture. -PT recommended rehabilitation  REVIEW OF SYSTEMS:  Review of Systems  Constitutional: Positive for malaise/fatigue. Negative for fever and chills.  HENT: Negative for ear discharge, ear pain and nosebleeds.   Eyes: Negative for blurred vision and double vision.  Respiratory: Negative for cough, shortness of breath and wheezing.   Cardiovascular: Negative for chest pain and palpitations.  Gastrointestinal: Negative for nausea, vomiting, abdominal pain, diarrhea and constipation.  Genitourinary: Negative for dysuria.  Musculoskeletal: Positive for myalgias, joint pain and falls.       Right thigh pain  Neurological: Negative for dizziness, speech change, focal weakness, seizures and headaches.  Psychiatric/Behavioral: Negative for depression.    DRUG ALLERGIES:  No Known Allergies  VITALS:  Blood pressure 128/99, pulse 82, temperature 98.1 F (36.7 C), temperature source Oral, resp. rate 18, height 5' (1.524 m), weight 44.951 kg (99 lb 1.6 oz), SpO2 89 %.  PHYSICAL EXAMINATION:  Physical Exam  GENERAL:  82 y.o.-year-old patient lying in the bed with no acute distress.  EYES: Pupils equal, round, reactive to light and accommodation. No scleral icterus. Extraocular muscles intact.  HEENT: Head atraumatic, normocephalic. Oropharynx and nasopharynx clear.  NECK:  Supple, no jugular venous distention. No thyroid enlargement, no tenderness.  LUNGS: Normal breath sounds bilaterally, no wheezing, rales,rhonchi or crepitation. No use of accessory muscles of respiration.  CARDIOVASCULAR: S1, S2 normal. No murmurs, rubs, or gallops.  ABDOMEN: Soft, nontender, nondistended.  Bowel sounds present. No organomegaly or mass.  EXTREMITIES: No pedal edema, cyanosis, or clubbing. Right lateral thigh pain/tenderness to touch. NEUROLOGIC: Cranial nerves II through XII are intact. Muscle strength 5/5 in all extremities. Sensation intact. Gait not checked.  PSYCHIATRIC: The patient is alert and oriented to self.  SKIN: No obvious rash, lesion, or ulcer.    LABORATORY PANEL:   CBC  Recent Labs Lab 10/08/15 1610  WBC 9.2  HGB 14.3  HCT 41.8  PLT 153   ------------------------------------------------------------------------------------------------------------------  Chemistries   Recent Labs Lab 10/08/15 1610  NA 145  K 3.5  CL 111  CO2 26  GLUCOSE 93  BUN 25*  CREATININE 0.57  CALCIUM 8.7*   ------------------------------------------------------------------------------------------------------------------  Cardiac Enzymes No results for input(s): TROPONINI in the last 168 hours. ------------------------------------------------------------------------------------------------------------------  RADIOLOGY:  Dg Thoracic Spine 2 View  10/08/2015  CLINICAL DATA:  Fall right groin pain.  Mid back pain. EXAM: THORACIC SPINE 2 VIEWS COMPARISON:  10/09/2012 FINDINGS: Diffuse osteopenia. No fracture or malalignment. Diffuse aortic calcifications. IMPRESSION: No acute bony abnormality. Aortic atherosclerosis. Electronically Signed   By: Charlett NoseKevin  Dover M.D.   On: 10/08/2015 14:14   Ct Hip Right Wo Contrast  10/08/2015  CLINICAL DATA:  Status post fall.  Right hip pain. EXAM: CT OF THE RIGHT HIP WITHOUT CONTRAST TECHNIQUE: Multidetector CT imaging of the right hip was performed according to the standard protocol. Multiplanar CT image reconstructions were also generated. COMPARISON:  None. FINDINGS: Bones/Joint/Cartilage No aggressive lytic or sclerotic osseous lesion. Generalized osteopenia. No acute fracture or dislocation. Normal alignment. Mild degenerative changes of  the visualize right SI joint. Muscles Normal. Soft tissue No fluid collection or hematoma. No soft tissue mass. Soft tissue  contusion posteriorly overlying the right greater trochanter. Peripheral vascular atherosclerotic disease. IMPRESSION: 1.  No acute osseous injury of the right hip. Electronically Signed   By: Elige KoHetal  Patel   On: 10/08/2015 16:26   Dg Hip Unilat With Pelvis 2-3 Views Right  10/08/2015  CLINICAL DATA:  Fall Monday. EXAM: DG HIP (WITH OR WITHOUT PELVIS) 2-3V RIGHT COMPARISON:  None. FINDINGS: Mild degenerative changes in the hips bilaterally. Diffuse osteopenia. No acute bony abnormality. Specifically, no fracture, subluxation, or dislocation. Soft tissues are intact. Aortoiliac calcifications noted. IMPRESSION: No acute bony abnormality. Aortoiliac atherosclerosis. Electronically Signed   By: Charlett NoseKevin  Dover M.D.   On: 10/08/2015 14:15    EKG:   Orders placed or performed during the hospital encounter of 10/08/15  . EKG 12-Lead  . EKG 12-Lead    ASSESSMENT AND PLAN:   82 year old female with past medical history of hypertension, dementia, osteoarthritis and osteoporosis presented from home after fall and right hip pain  #1 right hip pain-likely muscular injury. CT of the hip reviewed with orthopedics as well, no bony pathology identified. No fracture. -No significant hematoma or swelling noted either. Continue with ice pack, pain medications and physical therapy. -We will need rehabilitation. -If does not improve with conservative therapy, orthopedic follow-up in 1-2 weeks  #2 hypertension-started on Norvasc  #3 history of TIAs-on aspirin  #4 dementia-pleasantly confused. Seems to be at baseline.  #5 falls-secondary to generalized deconditioning. Physical therapy recommended rehabilitation. Social worker consulted   Possible discharge tomorrow if no complications   All the records are reviewed and case discussed with Care Management/Social Workerr. Management plans  discussed with the patient, family and they are in agreement.  CODE STATUS: Full code  TOTAL TIME TAKING CARE OF THIS PATIENT: 37 minutes.   POSSIBLE D/C IN 1-2 DAYS, DEPENDING ON CLINICAL CONDITION.   Vahan Wadsworth M.D on 10/09/2015 at 2:53 PM  Between 7am to 6pm - Pager - [phone]  After 6pm go to [email or web address] - password [email or web address] [facility]  Office  [phone]  CC: Primary care physician; Patrice ParadiseMCLAUGHLIN, MIRIAM K, MD

## 2015-10-09 NOTE — Clinical Social Work Note (Signed)
Clinical Social Work Assessment  Patient Details  Name: Allison Sullivan MRN: [id number] Date of Birth: Aug 30, 1930  Date of referral:  10/09/15               Reason for consult:  Facility Placement                Permission sought to share information with:  Chartered certified accountant granted to share information::  Yes, Verbal Permission Granted  Name::      [place]::   [place]   Relationship::     Contact Information:     Housing/Transportation Living arrangements for the past 2 months:  [place] of Information:  Adult Children Patient Interpreter Needed:  None Criminal Activity/Legal Involvement Pertinent to Current Situation/Hospitalization:  No - Comment as needed Significant Relationships:  Adult Children Lives with:  Adult Children Do you feel safe going back to the place where you live?  Yes Need for family participation in patient care:  Yes (Comment)  Care giving concerns:  Patient lives with her daughter Allison Sullivan [phone] in [place].    Social Worker assessment / plan:  Holiday representative ([place]) reviewed chart and noted that patient's PCP was trying to place patient at a SNF or ALF from home. CSW met with patient and her son Allison Sullivan was at bedside. Patient was pleasantly confused and working with PT. PT is recommending SNF. Per son patient lives with her daughter Allison Sullivan and he lives across the street from them. Per son patient's PCP was attempting to place patient at home because they could no longer take care of patient at home. CSW explained SNF process and that patient's insurance [employer] [facility] will have to approve SNF stay. CSW also discussed long term care options including Medicaid and private pay. Son is agreeable to SNF search in [place]. CSW also contacted patient's daughter Allison Sullivan and made her aware of above. Allison Sullivan is agreeable to SNF search and prefers [facility].   FL2 complete and faxed out. Amy [employer]  [facility] Case Manager is aware of above. CSW will continue to follow and assist as needed.    Employment status:  Retired Nurse, adult PT Recommendations:  [place] / Referral to community resources:  [place]  Patient/Family's Response to care:  Patient was pleasantly confused. Patient's son and daughter are agreeable to SNF search.   Patient/Family's Understanding of and Emotional Response to Diagnosis, Current Treatment, and Prognosis:  Patient's son and daughter thanked CSW for assistance.   Emotional Assessment Appearance:  Appears stated age Attitude/Demeanor/Rapport:  Unable to Assess Affect (typically observed):  Pleasant Orientation:  Oriented to Self, Oriented to Place, Fluctuating Orientation (Suspected and/or reported Sundowners) Alcohol / Substance use:  Not Applicable Psych involvement (Current and /or in the community):  No (Comment)  Discharge Needs  Concerns to be addressed:  Discharge Planning Concerns Readmission within the last 30 days:  No Current discharge risk:  Dependent with Mobility, Cognitively Impaired Barriers to Discharge:  Continued Medical Work up   Loralyn Freshwater, LCSW 10/09/2015, 2:06 PM

## 2015-10-09 NOTE — Care Management Note (Signed)
Case Management Note  Patient Details  Name: Allison Sullivan MRN: [id number] Date of Birth: [birth year]  Subjective/Objective:     Patient is pleasantly confused. Spoke with daughter Allison Sullivan over the phone.  Patient  Recommended for SNF placement and CSW following for bed placements.                Action/Plan: SNF.   Expected Discharge Date:  10/09/15               Expected Discharge Plan:  Skilled Nursing Facility  In-House Referral:  Clinical Social Work  Discharge planning Services  CM Consult  Post Acute Care Choice:    Choice offered to:     DME Arranged:    DME Agency:     HH Arranged:    HH Agency:     Status of Service:  In process, will continue to follow  If discussed at Long Length of Stay Meetings, dates discussed:    Additional Comments:  Adonis HugueninBerkhead, Lindy Pennisi L, RN 10/09/2015, 2:26 PM

## 2015-10-09 NOTE — Progress Notes (Signed)
Clinical Child psychotherapistocial Worker (CSW) called patient's daughter [name] and presented bed offers. Daughter chose [facility]. [name] liaison is aware of accepted bed offer. Amy [facility] [facility] case manager is aware of above. CSW will continue to follow and assist as needed.   Jetta LoutBailey Morgan, LCSW [phone]

## 2015-10-10 DIAGNOSIS — Z9181 History of falling: Secondary | ICD-10-CM | POA: Diagnosis not present

## 2015-10-10 DIAGNOSIS — F0280 Dementia in other diseases classified elsewhere without behavioral disturbance: Secondary | ICD-10-CM | POA: Diagnosis not present

## 2015-10-10 DIAGNOSIS — F0390 Unspecified dementia without behavioral disturbance: Secondary | ICD-10-CM | POA: Diagnosis not present

## 2015-10-10 DIAGNOSIS — I700 Atherosclerosis of aorta: Secondary | ICD-10-CM | POA: Diagnosis not present

## 2015-10-10 DIAGNOSIS — Z5189 Encounter for other specified aftercare: Secondary | ICD-10-CM | POA: Diagnosis not present

## 2015-10-10 DIAGNOSIS — Z8673 Personal history of transient ischemic attack (TIA), and cerebral infarction without residual deficits: Secondary | ICD-10-CM | POA: Diagnosis not present

## 2015-10-10 DIAGNOSIS — M6281 Muscle weakness (generalized): Secondary | ICD-10-CM | POA: Diagnosis not present

## 2015-10-10 DIAGNOSIS — E079 Disorder of thyroid, unspecified: Secondary | ICD-10-CM | POA: Diagnosis not present

## 2015-10-10 DIAGNOSIS — E785 Hyperlipidemia, unspecified: Secondary | ICD-10-CM | POA: Diagnosis not present

## 2015-10-10 DIAGNOSIS — Z7401 Bed confinement status: Secondary | ICD-10-CM | POA: Diagnosis not present

## 2015-10-10 DIAGNOSIS — M1990 Unspecified osteoarthritis, unspecified site: Secondary | ICD-10-CM | POA: Diagnosis not present

## 2015-10-10 DIAGNOSIS — G459 Transient cerebral ischemic attack, unspecified: Secondary | ICD-10-CM | POA: Diagnosis not present

## 2015-10-10 DIAGNOSIS — F17210 Nicotine dependence, cigarettes, uncomplicated: Secondary | ICD-10-CM | POA: Diagnosis not present

## 2015-10-10 DIAGNOSIS — R531 Weakness: Secondary | ICD-10-CM | POA: Diagnosis not present

## 2015-10-10 DIAGNOSIS — M25551 Pain in right hip: Secondary | ICD-10-CM | POA: Diagnosis not present

## 2015-10-10 DIAGNOSIS — M25559 Pain in unspecified hip: Secondary | ICD-10-CM | POA: Diagnosis not present

## 2015-10-10 DIAGNOSIS — I639 Cerebral infarction, unspecified: Secondary | ICD-10-CM | POA: Diagnosis not present

## 2015-10-10 DIAGNOSIS — I10 Essential (primary) hypertension: Secondary | ICD-10-CM | POA: Diagnosis not present

## 2015-10-10 DIAGNOSIS — R41841 Cognitive communication deficit: Secondary | ICD-10-CM | POA: Diagnosis not present

## 2015-10-10 DIAGNOSIS — G933 Postviral fatigue syndrome: Secondary | ICD-10-CM | POA: Diagnosis not present

## 2015-10-10 MED ORDER — LIDOCAINE 5 % EX PTCH: 1.0000 | MEDICATED_PATCH | CUTANEOUS | Status: DC

## 2015-10-10 MED ORDER — TRAMADOL HCL 50 MG PO TABS: 50.0000 mg | ORAL_TABLET | Freq: Four times a day (QID) | ORAL | Status: DC | PRN

## 2015-10-10 MED ORDER — RISPERIDONE 0.5 MG PO TBDP: 0.5000 mg | ORAL_TABLET | Freq: Every evening | ORAL | Status: DC | PRN

## 2015-10-10 MED ORDER — AMLODIPINE BESYLATE 5 MG PO TABS: 5.0000 mg | ORAL_TABLET | Freq: Every day | ORAL | Status: DC

## 2015-10-10 NOTE — Progress Notes (Signed)
Patient is being discharged to [facility] today. Report called to Konrad DoloresKim Hicks, RN. IV removed and belongings packed. FAmily bedside and will follow EMS to facility.

## 2015-10-10 NOTE — Discharge Summary (Signed)
[facility] [place] [facility]   PATIENT NAME: Allison Sullivan    MR#:  [id number]  DATE OF BIRTH:  July 26, 1930  DATE OF ADMISSION:  10/08/2015 ADMITTING PHYSICIAN: Houston SirenVivek J Sainani, MD  DATE OF DISCHARGE: 10/10/2015  PRIMARY CARE PHYSICIAN: Patrice ParadiseMCLAUGHLIN, MIRIAM K, MD    ADMISSION DIAGNOSIS:  Right hip pain [M25.551]  DISCHARGE DIAGNOSIS:  Active Problems:   Hip pain   SECONDARY DIAGNOSIS:   Past Medical History  Diagnosis Date  . Hypertension   . Hyperlipidemia   . Degenerative joint disease     degerative disc disease  . Osteoporosis   . Spondylolisthesis     with foraminal stenosis  . Thyroid disease 1979    Thyroid lesion  . Stroke [facility](HCC)     mini stroke-2 yrs ago  . COPD (chronic obstructive pulmonary disease) (HCC)     Documented  . Dementia     HOSPITAL COURSE:   82 year old female with past medical history of hypertension, dementia, osteoarthritis and osteoporosis presented from home after fall and right hip pain  #1 right hip pain-likely muscular injury. CT of the hip reviewed with orthopedics as well, no bony pathology identified. No fracture. -No significant hematoma or swelling noted either. Continue with ice pack, pain medications and physical therapy. -We will need rehabilitation. -Tramadol as needed and Lidoderm patch. -If does not improve with conservative therapy, orthopedic follow-up in 1-2 weeks  #2 hypertension-started on Norvasc  #3 history of TIAs-on aspirin  #4 dementia-pleasantly confused. Seems to be at baseline. Risperdal at bedtime if needed  #5 falls-secondary to generalized deconditioning. Physical therapy recommended rehabilitation. Social worker consulted  [facility] today  DISCHARGE CONDITIONS:   Guarded  CONSULTS OBTAINED:   none  DRUG ALLERGIES:  No Known Allergies  DISCHARGE MEDICATIONS:   Current Discharge Medication List    START taking these  medications   Details  amLODipine (NORVASC) 5 MG tablet Take 1 tablet (5 mg total) by mouth daily. Qty: 30 tablet, Refills: 0    lidocaine (LIDODERM) 5 % Place 1 patch onto the skin daily. Remove & Discard patch within 12 hours or as directed by MD Apply to right hip/upper thigh area Qty: 30 patch, Refills: 0    risperiDONE (RISPERDAL M-TABS) 0.5 MG disintegrating tablet Take 1 tablet (0.5 mg total) by mouth at bedtime as needed (agitation). Qty: 30 tablet, Refills: 0    traMADol (ULTRAM) 50 MG tablet Take 1 tablet (50 mg total) by mouth every 6 (six) hours as needed for moderate pain or severe pain. Qty: 20 tablet, Refills: 0      CONTINUE these medications which have NOT CHANGED   Details  aspirin 81 MG tablet Take 81 mg by mouth daily. am    Calcium Carbonate-Vitamin D (CALTRATE 600+D PO) Take 1 tablet by mouth 2 (two) times daily. sporatic         DISCHARGE INSTRUCTIONS:   1. PCP follow-up in 1-2 weeks  If you experience worsening of your admission symptoms, develop shortness of breath, life threatening emergency, suicidal or homicidal thoughts you must seek medical attention immediately by calling 911 or calling your MD immediately  if symptoms less severe.  You Must read complete instructions/literature along with all the possible adverse reactions/side effects for all the Medicines you take and that have been prescribed to you. Take any new Medicines after you have completely understood and accept all the possible adverse reactions/side effects.   Please note  You were cared for by a hospitalist during your hospital stay. If you have any questions about your discharge medications or the care you received while you were in the hospital after you are discharged, you can call the unit and asked to speak with the hospitalist on call if the hospitalist that took care of you is not available. Once you are discharged, your primary care physician will handle any further medical  issues. Please note that NO REFILLS for any discharge medications will be authorized once you are discharged, as it is imperative that you return to your primary care physician (or establish a relationship with a primary care physician if you do not have one) for your aftercare needs so that they can reassess your need for medications and monitor your lab values.    Today   CHIEF COMPLAINT:   Chief Complaint  Patient presents with  . Fall    VITAL SIGNS:  Blood pressure 133/59, pulse 95, temperature 98.2 F (36.8 C), temperature source Oral, resp. rate 18, height 5' (1.524 m), weight 44.951 kg (99 lb 1.6 oz), SpO2 99 %.  I/O:   Intake/Output Summary (Last 24 hours) at 10/10/15 0840 Last data filed at 10/09/15 2230  Gross per 24 hour  Intake    360 ml  Output      0 ml  Net    360 ml    PHYSICAL EXAMINATION:   Physical Exam  GENERAL: 82 y.o.-year-old patient lying in the bed with no acute distress.  EYES: Pupils equal, round, reactive to light and accommodation. No scleral icterus. Extraocular muscles intact.  HEENT: Head atraumatic, normocephalic. Oropharynx and nasopharynx clear.  NECK: Supple, no jugular venous distention. No thyroid enlargement, no tenderness.  LUNGS: Normal breath sounds bilaterally, no wheezing, rales,rhonchi or crepitation. No use of accessory muscles of respiration.  CARDIOVASCULAR: S1, S2 normal. No murmurs, rubs, or gallops.  ABDOMEN: Soft, nontender, nondistended. Bowel sounds present. No organomegaly or mass.  EXTREMITIES: No pedal edema, cyanosis, or clubbing. Right lateral thigh pain/tenderness to touch. NEUROLOGIC: Cranial nerves II through XII are intact. Muscle strength 5/5 in all extremities. Sensation intact. Gait not checked.  PSYCHIATRIC: The patient is alert and oriented to self.  SKIN: No obvious rash, lesion, or ulcer.   DATA REVIEW:   CBC  Recent Labs Lab 10/08/15 1610  WBC 9.2  HGB 14.3  HCT 41.8  PLT 153     Chemistries   Recent Labs Lab 10/08/15 1610  NA 145  K 3.5  CL 111  CO2 26  GLUCOSE 93  BUN 25*  CREATININE 0.57  CALCIUM 8.7*    Cardiac Enzymes No results for input(s): TROPONINI in the last 168 hours.  Microbiology Results  No results found for this or any previous visit.  RADIOLOGY:  Dg Thoracic Spine 2 View  10/08/2015  CLINICAL DATA:  Fall right groin pain.  Mid back pain. EXAM: THORACIC SPINE 2 VIEWS COMPARISON:  10/09/2012 FINDINGS: Diffuse osteopenia. No fracture or malalignment. Diffuse aortic calcifications. IMPRESSION: No acute bony abnormality. Aortic atherosclerosis. Electronically Signed   By: Charlett Nose M.D.   On: 10/08/2015 14:14   Ct Hip Right Wo Contrast  10/08/2015  CLINICAL DATA:  Status post fall.  Right hip pain. EXAM: CT OF THE RIGHT HIP WITHOUT CONTRAST TECHNIQUE: Multidetector CT imaging of the right hip was performed according to the standard protocol. Multiplanar CT image reconstructions were also generated. COMPARISON:  None. FINDINGS: Bones/Joint/Cartilage No aggressive lytic or sclerotic osseous lesion. Generalized  osteopenia. No acute fracture or dislocation. Normal alignment. Mild degenerative changes of the visualize right SI joint. Muscles Normal. Soft tissue No fluid collection or hematoma. No soft tissue mass. Soft tissue contusion posteriorly overlying the right greater trochanter. Peripheral vascular atherosclerotic disease. IMPRESSION: 1.  No acute osseous injury of the right hip. Electronically Signed   By: Elige KoHetal  Patel   On: 10/08/2015 16:26   Dg Hip Unilat With Pelvis 2-3 Views Right  10/08/2015  CLINICAL DATA:  Fall Monday. EXAM: DG HIP (WITH OR WITHOUT PELVIS) 2-3V RIGHT COMPARISON:  None. FINDINGS: Mild degenerative changes in the hips bilaterally. Diffuse osteopenia. No acute bony abnormality. Specifically, no fracture, subluxation, or dislocation. Soft tissues are intact. Aortoiliac calcifications noted. IMPRESSION: No acute bony  abnormality. Aortoiliac atherosclerosis. Electronically Signed   By: Charlett NoseKevin  Dover M.D.   On: 10/08/2015 14:15    EKG:   Orders placed or performed during the hospital encounter of 10/08/15  . EKG 12-Lead  . EKG 12-Lead      Management plans discussed with the patient, family and they are in agreement.  CODE STATUS:     Code Status Orders        Start     Ordered   10/08/15 1812  Full code   Continuous     10/08/15 1811    Code Status History    Date Active Date Inactive Code Status Order ID Comments User Context   This patient has a current code status but no historical code status.      TOTAL TIME TAKING CARE OF THIS PATIENT:37 minutes.    Enid BaasKALISETTI,Zahari Xiang M.D on 10/10/2015 at 8:40 AM  Between 7am to 6pm - Pager - [phone]  After 6pm go to [email or web address] - password [email or web address] [facility]  Office  [phone]  CC: Primary care physician; Patrice ParadiseMCLAUGHLIN, MIRIAM K, MD

## 2015-10-10 NOTE — Clinical Social Work Placement (Signed)
   CLINICAL SOCIAL WORK PLACEMENT  NOTE  Date:  10/10/2015  Patient Details  Name: Allison Sullivan MRN: [id number] Date of Birth: 08/05/1930  Clinical Social Work is seeking post-discharge placement for this patient at the Skilled  Nursing Facility level of care (*CSW will initial, date and re-position this form in  chart as items are completed):  Yes   Patient/family provided with [facility] Clinical Social Work Department's list of facilities offering this level of care within the geographic area requested by the patient (or if unable, by the patient's family).  Yes   Patient/family informed of their freedom to choose among providers that offer the needed level of care, that participate in Medicare, Medicaid or managed care program needed by the patient, have an available bed and are willing to accept the patient.  Yes   Patient/family informed of [facility]'s ownership interest in [facility] and [facility], as well as of the fact that they are under no obligation to receive care at these facilities.  PASRR submitted to EDS on 10/09/15     PASRR number received on 10/09/15     Existing PASRR number confirmed on       FL2 transmitted to all facilities in geographic area requested by pt/family on 10/09/15     FL2 transmitted to all facilities within larger geographic area on       Patient informed that his/her managed care company has contracts with or will negotiate with certain facilities, including the following:        Yes   Patient/family informed of bed offers received.  [facility]     Physician recommends and patient chooses bed at      Patient to be transferred to [facility] [place] on 10/10/15.  [facility]      Patient family notified on 10/10/15 of transfer.  Name of family member notified:  Pt's son and daughter     PHYSICIAN       Additional Comment:     _______________________________________________ Dede QuerySarah Antonieta Slaven, LCSW 10/10/2015, 11:15 AM

## 2015-10-10 NOTE — Clinical Social Work Note (Signed)
Pt is ready for discharge today and will go to [facility]. [employer] Berkley Harveyuth has been obtained (#[id number]). Facility is ready to admit pt as they have received discharge information. Pt's family is aware of discharge plan. RN will call report to room#807 at [phone]. [facility]. CSW is signing off as no further needs identified.   Dede QuerySarah Izick Gasbarro, MSW, LCSW  Clinical Social Worker [phone]

## 2015-10-14 DIAGNOSIS — F0390 Unspecified dementia without behavioral disturbance: Secondary | ICD-10-CM | POA: Diagnosis not present

## 2015-10-14 DIAGNOSIS — I10 Essential (primary) hypertension: Secondary | ICD-10-CM | POA: Diagnosis not present

## 2015-10-14 DIAGNOSIS — G459 Transient cerebral ischemic attack, unspecified: Secondary | ICD-10-CM | POA: Diagnosis not present

## 2015-10-14 DIAGNOSIS — M25559 Pain in unspecified hip: Secondary | ICD-10-CM | POA: Diagnosis not present

## 2015-10-25 DIAGNOSIS — Z8673 Personal history of transient ischemic attack (TIA), and cerebral infarction without residual deficits: Secondary | ICD-10-CM | POA: Diagnosis not present

## 2015-10-25 DIAGNOSIS — F0390 Unspecified dementia without behavioral disturbance: Secondary | ICD-10-CM | POA: Diagnosis not present

## 2015-10-25 DIAGNOSIS — G933 Postviral fatigue syndrome: Secondary | ICD-10-CM | POA: Diagnosis not present

## 2015-10-25 DIAGNOSIS — I10 Essential (primary) hypertension: Secondary | ICD-10-CM | POA: Diagnosis not present

## 2015-10-31 DIAGNOSIS — Z8673 Personal history of transient ischemic attack (TIA), and cerebral infarction without residual deficits: Secondary | ICD-10-CM | POA: Diagnosis not present

## 2015-10-31 DIAGNOSIS — M1991 Primary osteoarthritis, unspecified site: Secondary | ICD-10-CM | POA: Diagnosis not present

## 2015-10-31 DIAGNOSIS — M79675 Pain in left toe(s): Secondary | ICD-10-CM | POA: Diagnosis not present

## 2015-10-31 DIAGNOSIS — Z9181 History of falling: Secondary | ICD-10-CM | POA: Diagnosis not present

## 2015-10-31 DIAGNOSIS — F0390 Unspecified dementia without behavioral disturbance: Secondary | ICD-10-CM | POA: Diagnosis not present

## 2015-10-31 DIAGNOSIS — I10 Essential (primary) hypertension: Secondary | ICD-10-CM | POA: Diagnosis not present

## 2015-10-31 DIAGNOSIS — E785 Hyperlipidemia, unspecified: Secondary | ICD-10-CM | POA: Diagnosis not present

## 2015-10-31 DIAGNOSIS — M4310 Spondylolisthesis, site unspecified: Secondary | ICD-10-CM | POA: Diagnosis not present

## 2015-10-31 DIAGNOSIS — M79674 Pain in right toe(s): Secondary | ICD-10-CM | POA: Diagnosis not present

## 2015-10-31 DIAGNOSIS — J449 Chronic obstructive pulmonary disease, unspecified: Secondary | ICD-10-CM | POA: Diagnosis not present

## 2015-10-31 DIAGNOSIS — M25551 Pain in right hip: Secondary | ICD-10-CM | POA: Diagnosis not present

## 2015-10-31 DIAGNOSIS — B351 Tinea unguium: Secondary | ICD-10-CM | POA: Diagnosis not present

## 2015-11-05 DIAGNOSIS — F0390 Unspecified dementia without behavioral disturbance: Secondary | ICD-10-CM | POA: Diagnosis not present

## 2015-11-05 DIAGNOSIS — Z8673 Personal history of transient ischemic attack (TIA), and cerebral infarction without residual deficits: Secondary | ICD-10-CM | POA: Diagnosis not present

## 2015-11-05 DIAGNOSIS — Z9181 History of falling: Secondary | ICD-10-CM | POA: Diagnosis not present

## 2015-11-05 DIAGNOSIS — M25551 Pain in right hip: Secondary | ICD-10-CM | POA: Diagnosis not present

## 2015-11-05 DIAGNOSIS — M4310 Spondylolisthesis, site unspecified: Secondary | ICD-10-CM | POA: Diagnosis not present

## 2015-11-05 DIAGNOSIS — J449 Chronic obstructive pulmonary disease, unspecified: Secondary | ICD-10-CM | POA: Diagnosis not present

## 2015-11-05 DIAGNOSIS — M1991 Primary osteoarthritis, unspecified site: Secondary | ICD-10-CM | POA: Diagnosis not present

## 2015-11-05 DIAGNOSIS — I10 Essential (primary) hypertension: Secondary | ICD-10-CM | POA: Diagnosis not present

## 2015-11-05 DIAGNOSIS — E785 Hyperlipidemia, unspecified: Secondary | ICD-10-CM | POA: Diagnosis not present

## 2015-11-06 DIAGNOSIS — F0280 Dementia in other diseases classified elsewhere without behavioral disturbance: Secondary | ICD-10-CM | POA: Diagnosis not present

## 2015-11-06 DIAGNOSIS — J449 Chronic obstructive pulmonary disease, unspecified: Secondary | ICD-10-CM | POA: Diagnosis not present

## 2015-11-06 DIAGNOSIS — I10 Essential (primary) hypertension: Secondary | ICD-10-CM | POA: Diagnosis not present

## 2015-11-06 DIAGNOSIS — M25551 Pain in right hip: Secondary | ICD-10-CM | POA: Diagnosis not present

## 2015-11-06 DIAGNOSIS — M810 Age-related osteoporosis without current pathological fracture: Secondary | ICD-10-CM | POA: Diagnosis not present

## 2015-11-06 DIAGNOSIS — G301 Alzheimer's disease with late onset: Secondary | ICD-10-CM | POA: Diagnosis not present

## 2015-11-07 DIAGNOSIS — F0390 Unspecified dementia without behavioral disturbance: Secondary | ICD-10-CM | POA: Diagnosis not present

## 2015-11-07 DIAGNOSIS — Z8673 Personal history of transient ischemic attack (TIA), and cerebral infarction without residual deficits: Secondary | ICD-10-CM | POA: Diagnosis not present

## 2015-11-07 DIAGNOSIS — Z9181 History of falling: Secondary | ICD-10-CM | POA: Diagnosis not present

## 2015-11-07 DIAGNOSIS — M25551 Pain in right hip: Secondary | ICD-10-CM | POA: Diagnosis not present

## 2015-11-07 DIAGNOSIS — M1991 Primary osteoarthritis, unspecified site: Secondary | ICD-10-CM | POA: Diagnosis not present

## 2015-11-07 DIAGNOSIS — I10 Essential (primary) hypertension: Secondary | ICD-10-CM | POA: Diagnosis not present

## 2015-11-07 DIAGNOSIS — J449 Chronic obstructive pulmonary disease, unspecified: Secondary | ICD-10-CM | POA: Diagnosis not present

## 2015-11-07 DIAGNOSIS — E785 Hyperlipidemia, unspecified: Secondary | ICD-10-CM | POA: Diagnosis not present

## 2015-11-07 DIAGNOSIS — M4310 Spondylolisthesis, site unspecified: Secondary | ICD-10-CM | POA: Diagnosis not present

## 2015-11-10 DIAGNOSIS — Z8673 Personal history of transient ischemic attack (TIA), and cerebral infarction without residual deficits: Secondary | ICD-10-CM | POA: Diagnosis not present

## 2015-11-10 DIAGNOSIS — Z9181 History of falling: Secondary | ICD-10-CM | POA: Diagnosis not present

## 2015-11-10 DIAGNOSIS — M25551 Pain in right hip: Secondary | ICD-10-CM | POA: Diagnosis not present

## 2015-11-10 DIAGNOSIS — M1991 Primary osteoarthritis, unspecified site: Secondary | ICD-10-CM | POA: Diagnosis not present

## 2015-11-10 DIAGNOSIS — J449 Chronic obstructive pulmonary disease, unspecified: Secondary | ICD-10-CM | POA: Diagnosis not present

## 2015-11-10 DIAGNOSIS — E785 Hyperlipidemia, unspecified: Secondary | ICD-10-CM | POA: Diagnosis not present

## 2015-11-10 DIAGNOSIS — F0390 Unspecified dementia without behavioral disturbance: Secondary | ICD-10-CM | POA: Diagnosis not present

## 2015-11-10 DIAGNOSIS — I10 Essential (primary) hypertension: Secondary | ICD-10-CM | POA: Diagnosis not present

## 2015-11-10 DIAGNOSIS — M4310 Spondylolisthesis, site unspecified: Secondary | ICD-10-CM | POA: Diagnosis not present

## 2015-11-12 DIAGNOSIS — M25551 Pain in right hip: Secondary | ICD-10-CM | POA: Diagnosis not present

## 2015-11-12 DIAGNOSIS — J449 Chronic obstructive pulmonary disease, unspecified: Secondary | ICD-10-CM | POA: Diagnosis not present

## 2015-11-12 DIAGNOSIS — E785 Hyperlipidemia, unspecified: Secondary | ICD-10-CM | POA: Diagnosis not present

## 2015-11-12 DIAGNOSIS — Z8673 Personal history of transient ischemic attack (TIA), and cerebral infarction without residual deficits: Secondary | ICD-10-CM | POA: Diagnosis not present

## 2015-11-12 DIAGNOSIS — M1991 Primary osteoarthritis, unspecified site: Secondary | ICD-10-CM | POA: Diagnosis not present

## 2015-11-12 DIAGNOSIS — I10 Essential (primary) hypertension: Secondary | ICD-10-CM | POA: Diagnosis not present

## 2015-11-12 DIAGNOSIS — M4310 Spondylolisthesis, site unspecified: Secondary | ICD-10-CM | POA: Diagnosis not present

## 2015-11-12 DIAGNOSIS — F0390 Unspecified dementia without behavioral disturbance: Secondary | ICD-10-CM | POA: Diagnosis not present

## 2015-11-12 DIAGNOSIS — Z9181 History of falling: Secondary | ICD-10-CM | POA: Diagnosis not present

## 2015-11-13 DIAGNOSIS — M1991 Primary osteoarthritis, unspecified site: Secondary | ICD-10-CM | POA: Diagnosis not present

## 2015-11-13 DIAGNOSIS — M4310 Spondylolisthesis, site unspecified: Secondary | ICD-10-CM | POA: Diagnosis not present

## 2015-11-13 DIAGNOSIS — E785 Hyperlipidemia, unspecified: Secondary | ICD-10-CM | POA: Diagnosis not present

## 2015-11-13 DIAGNOSIS — Z9181 History of falling: Secondary | ICD-10-CM | POA: Diagnosis not present

## 2015-11-13 DIAGNOSIS — F0390 Unspecified dementia without behavioral disturbance: Secondary | ICD-10-CM | POA: Diagnosis not present

## 2015-11-13 DIAGNOSIS — J449 Chronic obstructive pulmonary disease, unspecified: Secondary | ICD-10-CM | POA: Diagnosis not present

## 2015-11-13 DIAGNOSIS — M25551 Pain in right hip: Secondary | ICD-10-CM | POA: Diagnosis not present

## 2015-11-13 DIAGNOSIS — Z8673 Personal history of transient ischemic attack (TIA), and cerebral infarction without residual deficits: Secondary | ICD-10-CM | POA: Diagnosis not present

## 2015-11-13 DIAGNOSIS — I10 Essential (primary) hypertension: Secondary | ICD-10-CM | POA: Diagnosis not present

## 2015-11-14 DIAGNOSIS — E785 Hyperlipidemia, unspecified: Secondary | ICD-10-CM | POA: Diagnosis not present

## 2015-11-14 DIAGNOSIS — I10 Essential (primary) hypertension: Secondary | ICD-10-CM | POA: Diagnosis not present

## 2015-11-14 DIAGNOSIS — Z9181 History of falling: Secondary | ICD-10-CM | POA: Diagnosis not present

## 2015-11-14 DIAGNOSIS — F0390 Unspecified dementia without behavioral disturbance: Secondary | ICD-10-CM | POA: Diagnosis not present

## 2015-11-14 DIAGNOSIS — M25551 Pain in right hip: Secondary | ICD-10-CM | POA: Diagnosis not present

## 2015-11-14 DIAGNOSIS — M4310 Spondylolisthesis, site unspecified: Secondary | ICD-10-CM | POA: Diagnosis not present

## 2015-11-14 DIAGNOSIS — M1991 Primary osteoarthritis, unspecified site: Secondary | ICD-10-CM | POA: Diagnosis not present

## 2015-11-14 DIAGNOSIS — J449 Chronic obstructive pulmonary disease, unspecified: Secondary | ICD-10-CM | POA: Diagnosis not present

## 2015-11-14 DIAGNOSIS — Z8673 Personal history of transient ischemic attack (TIA), and cerebral infarction without residual deficits: Secondary | ICD-10-CM | POA: Diagnosis not present

## 2015-11-17 DIAGNOSIS — M4310 Spondylolisthesis, site unspecified: Secondary | ICD-10-CM | POA: Diagnosis not present

## 2015-11-17 DIAGNOSIS — I10 Essential (primary) hypertension: Secondary | ICD-10-CM | POA: Diagnosis not present

## 2015-11-17 DIAGNOSIS — M1991 Primary osteoarthritis, unspecified site: Secondary | ICD-10-CM | POA: Diagnosis not present

## 2015-11-17 DIAGNOSIS — Z9181 History of falling: Secondary | ICD-10-CM | POA: Diagnosis not present

## 2015-11-17 DIAGNOSIS — Z8673 Personal history of transient ischemic attack (TIA), and cerebral infarction without residual deficits: Secondary | ICD-10-CM | POA: Diagnosis not present

## 2015-11-17 DIAGNOSIS — E785 Hyperlipidemia, unspecified: Secondary | ICD-10-CM | POA: Diagnosis not present

## 2015-11-17 DIAGNOSIS — M25551 Pain in right hip: Secondary | ICD-10-CM | POA: Diagnosis not present

## 2015-11-17 DIAGNOSIS — F0390 Unspecified dementia without behavioral disturbance: Secondary | ICD-10-CM | POA: Diagnosis not present

## 2015-11-17 DIAGNOSIS — J449 Chronic obstructive pulmonary disease, unspecified: Secondary | ICD-10-CM | POA: Diagnosis not present

## 2015-11-18 DIAGNOSIS — F0390 Unspecified dementia without behavioral disturbance: Secondary | ICD-10-CM | POA: Diagnosis not present

## 2015-11-18 DIAGNOSIS — M25551 Pain in right hip: Secondary | ICD-10-CM | POA: Diagnosis not present

## 2015-11-18 DIAGNOSIS — Z8673 Personal history of transient ischemic attack (TIA), and cerebral infarction without residual deficits: Secondary | ICD-10-CM | POA: Diagnosis not present

## 2015-11-18 DIAGNOSIS — I10 Essential (primary) hypertension: Secondary | ICD-10-CM | POA: Diagnosis not present

## 2015-11-18 DIAGNOSIS — Z9181 History of falling: Secondary | ICD-10-CM | POA: Diagnosis not present

## 2015-11-18 DIAGNOSIS — M1991 Primary osteoarthritis, unspecified site: Secondary | ICD-10-CM | POA: Diagnosis not present

## 2015-11-18 DIAGNOSIS — M4310 Spondylolisthesis, site unspecified: Secondary | ICD-10-CM | POA: Diagnosis not present

## 2015-11-18 DIAGNOSIS — E785 Hyperlipidemia, unspecified: Secondary | ICD-10-CM | POA: Diagnosis not present

## 2015-11-18 DIAGNOSIS — J449 Chronic obstructive pulmonary disease, unspecified: Secondary | ICD-10-CM | POA: Diagnosis not present

## 2015-11-19 DIAGNOSIS — M1991 Primary osteoarthritis, unspecified site: Secondary | ICD-10-CM | POA: Diagnosis not present

## 2015-11-19 DIAGNOSIS — Z9181 History of falling: Secondary | ICD-10-CM | POA: Diagnosis not present

## 2015-11-19 DIAGNOSIS — M4310 Spondylolisthesis, site unspecified: Secondary | ICD-10-CM | POA: Diagnosis not present

## 2015-11-19 DIAGNOSIS — I10 Essential (primary) hypertension: Secondary | ICD-10-CM | POA: Diagnosis not present

## 2015-11-19 DIAGNOSIS — E785 Hyperlipidemia, unspecified: Secondary | ICD-10-CM | POA: Diagnosis not present

## 2015-11-19 DIAGNOSIS — F0390 Unspecified dementia without behavioral disturbance: Secondary | ICD-10-CM | POA: Diagnosis not present

## 2015-11-19 DIAGNOSIS — M25551 Pain in right hip: Secondary | ICD-10-CM | POA: Diagnosis not present

## 2015-11-19 DIAGNOSIS — J449 Chronic obstructive pulmonary disease, unspecified: Secondary | ICD-10-CM | POA: Diagnosis not present

## 2015-11-19 DIAGNOSIS — Z8673 Personal history of transient ischemic attack (TIA), and cerebral infarction without residual deficits: Secondary | ICD-10-CM | POA: Diagnosis not present

## 2015-11-20 DIAGNOSIS — M25551 Pain in right hip: Secondary | ICD-10-CM | POA: Diagnosis not present

## 2015-11-20 DIAGNOSIS — M4310 Spondylolisthesis, site unspecified: Secondary | ICD-10-CM | POA: Diagnosis not present

## 2015-11-20 DIAGNOSIS — J449 Chronic obstructive pulmonary disease, unspecified: Secondary | ICD-10-CM | POA: Diagnosis not present

## 2015-11-20 DIAGNOSIS — E785 Hyperlipidemia, unspecified: Secondary | ICD-10-CM | POA: Diagnosis not present

## 2015-11-20 DIAGNOSIS — Z9181 History of falling: Secondary | ICD-10-CM | POA: Diagnosis not present

## 2015-11-20 DIAGNOSIS — F0390 Unspecified dementia without behavioral disturbance: Secondary | ICD-10-CM | POA: Diagnosis not present

## 2015-11-20 DIAGNOSIS — Z8673 Personal history of transient ischemic attack (TIA), and cerebral infarction without residual deficits: Secondary | ICD-10-CM | POA: Diagnosis not present

## 2015-11-20 DIAGNOSIS — I10 Essential (primary) hypertension: Secondary | ICD-10-CM | POA: Diagnosis not present

## 2015-11-20 DIAGNOSIS — M1991 Primary osteoarthritis, unspecified site: Secondary | ICD-10-CM | POA: Diagnosis not present

## 2015-11-25 DIAGNOSIS — M4310 Spondylolisthesis, site unspecified: Secondary | ICD-10-CM | POA: Diagnosis not present

## 2015-11-25 DIAGNOSIS — M1991 Primary osteoarthritis, unspecified site: Secondary | ICD-10-CM | POA: Diagnosis not present

## 2015-11-25 DIAGNOSIS — E785 Hyperlipidemia, unspecified: Secondary | ICD-10-CM | POA: Diagnosis not present

## 2015-11-25 DIAGNOSIS — Z9181 History of falling: Secondary | ICD-10-CM | POA: Diagnosis not present

## 2015-11-25 DIAGNOSIS — Z8673 Personal history of transient ischemic attack (TIA), and cerebral infarction without residual deficits: Secondary | ICD-10-CM | POA: Diagnosis not present

## 2015-11-25 DIAGNOSIS — I10 Essential (primary) hypertension: Secondary | ICD-10-CM | POA: Diagnosis not present

## 2015-11-25 DIAGNOSIS — J449 Chronic obstructive pulmonary disease, unspecified: Secondary | ICD-10-CM | POA: Diagnosis not present

## 2015-11-25 DIAGNOSIS — F0390 Unspecified dementia without behavioral disturbance: Secondary | ICD-10-CM | POA: Diagnosis not present

## 2015-11-25 DIAGNOSIS — M25551 Pain in right hip: Secondary | ICD-10-CM | POA: Diagnosis not present

## 2015-11-26 DIAGNOSIS — Z8673 Personal history of transient ischemic attack (TIA), and cerebral infarction without residual deficits: Secondary | ICD-10-CM | POA: Diagnosis not present

## 2015-11-26 DIAGNOSIS — M25551 Pain in right hip: Secondary | ICD-10-CM | POA: Diagnosis not present

## 2015-11-26 DIAGNOSIS — J449 Chronic obstructive pulmonary disease, unspecified: Secondary | ICD-10-CM | POA: Diagnosis not present

## 2015-11-26 DIAGNOSIS — Z9181 History of falling: Secondary | ICD-10-CM | POA: Diagnosis not present

## 2015-11-26 DIAGNOSIS — M4310 Spondylolisthesis, site unspecified: Secondary | ICD-10-CM | POA: Diagnosis not present

## 2015-11-26 DIAGNOSIS — M1991 Primary osteoarthritis, unspecified site: Secondary | ICD-10-CM | POA: Diagnosis not present

## 2015-11-26 DIAGNOSIS — I10 Essential (primary) hypertension: Secondary | ICD-10-CM | POA: Diagnosis not present

## 2015-11-26 DIAGNOSIS — E785 Hyperlipidemia, unspecified: Secondary | ICD-10-CM | POA: Diagnosis not present

## 2015-11-26 DIAGNOSIS — F0390 Unspecified dementia without behavioral disturbance: Secondary | ICD-10-CM | POA: Diagnosis not present

## 2015-11-27 DIAGNOSIS — Z9181 History of falling: Secondary | ICD-10-CM | POA: Diagnosis not present

## 2015-11-27 DIAGNOSIS — Z8673 Personal history of transient ischemic attack (TIA), and cerebral infarction without residual deficits: Secondary | ICD-10-CM | POA: Diagnosis not present

## 2015-11-27 DIAGNOSIS — M25551 Pain in right hip: Secondary | ICD-10-CM | POA: Diagnosis not present

## 2015-11-27 DIAGNOSIS — M4310 Spondylolisthesis, site unspecified: Secondary | ICD-10-CM | POA: Diagnosis not present

## 2015-11-27 DIAGNOSIS — F0390 Unspecified dementia without behavioral disturbance: Secondary | ICD-10-CM | POA: Diagnosis not present

## 2015-11-27 DIAGNOSIS — J449 Chronic obstructive pulmonary disease, unspecified: Secondary | ICD-10-CM | POA: Diagnosis not present

## 2015-11-27 DIAGNOSIS — E785 Hyperlipidemia, unspecified: Secondary | ICD-10-CM | POA: Diagnosis not present

## 2015-11-27 DIAGNOSIS — M1991 Primary osteoarthritis, unspecified site: Secondary | ICD-10-CM | POA: Diagnosis not present

## 2015-11-27 DIAGNOSIS — I10 Essential (primary) hypertension: Secondary | ICD-10-CM | POA: Diagnosis not present

## 2015-11-29 DIAGNOSIS — Z8673 Personal history of transient ischemic attack (TIA), and cerebral infarction without residual deficits: Secondary | ICD-10-CM | POA: Diagnosis not present

## 2015-11-29 DIAGNOSIS — I10 Essential (primary) hypertension: Secondary | ICD-10-CM | POA: Diagnosis not present

## 2015-11-29 DIAGNOSIS — M25551 Pain in right hip: Secondary | ICD-10-CM | POA: Diagnosis not present

## 2015-11-29 DIAGNOSIS — Z9181 History of falling: Secondary | ICD-10-CM | POA: Diagnosis not present

## 2015-11-29 DIAGNOSIS — M1991 Primary osteoarthritis, unspecified site: Secondary | ICD-10-CM | POA: Diagnosis not present

## 2015-11-29 DIAGNOSIS — E785 Hyperlipidemia, unspecified: Secondary | ICD-10-CM | POA: Diagnosis not present

## 2015-11-29 DIAGNOSIS — J449 Chronic obstructive pulmonary disease, unspecified: Secondary | ICD-10-CM | POA: Diagnosis not present

## 2015-11-29 DIAGNOSIS — F0390 Unspecified dementia without behavioral disturbance: Secondary | ICD-10-CM | POA: Diagnosis not present

## 2015-11-29 DIAGNOSIS — M4310 Spondylolisthesis, site unspecified: Secondary | ICD-10-CM | POA: Diagnosis not present

## 2015-12-02 DIAGNOSIS — I10 Essential (primary) hypertension: Secondary | ICD-10-CM | POA: Diagnosis not present

## 2015-12-02 DIAGNOSIS — Z8673 Personal history of transient ischemic attack (TIA), and cerebral infarction without residual deficits: Secondary | ICD-10-CM | POA: Diagnosis not present

## 2015-12-02 DIAGNOSIS — E785 Hyperlipidemia, unspecified: Secondary | ICD-10-CM | POA: Diagnosis not present

## 2015-12-02 DIAGNOSIS — M1991 Primary osteoarthritis, unspecified site: Secondary | ICD-10-CM | POA: Diagnosis not present

## 2015-12-02 DIAGNOSIS — M4310 Spondylolisthesis, site unspecified: Secondary | ICD-10-CM | POA: Diagnosis not present

## 2015-12-02 DIAGNOSIS — J449 Chronic obstructive pulmonary disease, unspecified: Secondary | ICD-10-CM | POA: Diagnosis not present

## 2015-12-02 DIAGNOSIS — Z9181 History of falling: Secondary | ICD-10-CM | POA: Diagnosis not present

## 2015-12-02 DIAGNOSIS — M25551 Pain in right hip: Secondary | ICD-10-CM | POA: Diagnosis not present

## 2015-12-02 DIAGNOSIS — F0390 Unspecified dementia without behavioral disturbance: Secondary | ICD-10-CM | POA: Diagnosis not present

## 2015-12-04 DIAGNOSIS — I10 Essential (primary) hypertension: Secondary | ICD-10-CM | POA: Diagnosis not present

## 2015-12-04 DIAGNOSIS — J449 Chronic obstructive pulmonary disease, unspecified: Secondary | ICD-10-CM | POA: Diagnosis not present

## 2015-12-04 DIAGNOSIS — Z9181 History of falling: Secondary | ICD-10-CM | POA: Diagnosis not present

## 2015-12-04 DIAGNOSIS — Z8673 Personal history of transient ischemic attack (TIA), and cerebral infarction without residual deficits: Secondary | ICD-10-CM | POA: Diagnosis not present

## 2015-12-04 DIAGNOSIS — E785 Hyperlipidemia, unspecified: Secondary | ICD-10-CM | POA: Diagnosis not present

## 2015-12-04 DIAGNOSIS — F0390 Unspecified dementia without behavioral disturbance: Secondary | ICD-10-CM | POA: Diagnosis not present

## 2015-12-04 DIAGNOSIS — M25551 Pain in right hip: Secondary | ICD-10-CM | POA: Diagnosis not present

## 2015-12-04 DIAGNOSIS — M1991 Primary osteoarthritis, unspecified site: Secondary | ICD-10-CM | POA: Diagnosis not present

## 2015-12-04 DIAGNOSIS — M4310 Spondylolisthesis, site unspecified: Secondary | ICD-10-CM | POA: Diagnosis not present

## 2015-12-05 DIAGNOSIS — M1991 Primary osteoarthritis, unspecified site: Secondary | ICD-10-CM | POA: Diagnosis not present

## 2015-12-05 DIAGNOSIS — J449 Chronic obstructive pulmonary disease, unspecified: Secondary | ICD-10-CM | POA: Diagnosis not present

## 2015-12-05 DIAGNOSIS — Z8673 Personal history of transient ischemic attack (TIA), and cerebral infarction without residual deficits: Secondary | ICD-10-CM | POA: Diagnosis not present

## 2015-12-05 DIAGNOSIS — F0390 Unspecified dementia without behavioral disturbance: Secondary | ICD-10-CM | POA: Diagnosis not present

## 2015-12-05 DIAGNOSIS — M25551 Pain in right hip: Secondary | ICD-10-CM | POA: Diagnosis not present

## 2015-12-05 DIAGNOSIS — Z9181 History of falling: Secondary | ICD-10-CM | POA: Diagnosis not present

## 2015-12-05 DIAGNOSIS — E785 Hyperlipidemia, unspecified: Secondary | ICD-10-CM | POA: Diagnosis not present

## 2015-12-05 DIAGNOSIS — I10 Essential (primary) hypertension: Secondary | ICD-10-CM | POA: Diagnosis not present

## 2015-12-05 DIAGNOSIS — M4310 Spondylolisthesis, site unspecified: Secondary | ICD-10-CM | POA: Diagnosis not present

## 2015-12-06 DIAGNOSIS — Z8673 Personal history of transient ischemic attack (TIA), and cerebral infarction without residual deficits: Secondary | ICD-10-CM | POA: Diagnosis not present

## 2015-12-06 DIAGNOSIS — J449 Chronic obstructive pulmonary disease, unspecified: Secondary | ICD-10-CM | POA: Diagnosis not present

## 2015-12-06 DIAGNOSIS — F0390 Unspecified dementia without behavioral disturbance: Secondary | ICD-10-CM | POA: Diagnosis not present

## 2015-12-06 DIAGNOSIS — I10 Essential (primary) hypertension: Secondary | ICD-10-CM | POA: Diagnosis not present

## 2015-12-06 DIAGNOSIS — Z9181 History of falling: Secondary | ICD-10-CM | POA: Diagnosis not present

## 2015-12-06 DIAGNOSIS — E785 Hyperlipidemia, unspecified: Secondary | ICD-10-CM | POA: Diagnosis not present

## 2015-12-06 DIAGNOSIS — M4310 Spondylolisthesis, site unspecified: Secondary | ICD-10-CM | POA: Diagnosis not present

## 2015-12-06 DIAGNOSIS — M1991 Primary osteoarthritis, unspecified site: Secondary | ICD-10-CM | POA: Diagnosis not present

## 2015-12-06 DIAGNOSIS — M25551 Pain in right hip: Secondary | ICD-10-CM | POA: Diagnosis not present

## 2015-12-09 DIAGNOSIS — J449 Chronic obstructive pulmonary disease, unspecified: Secondary | ICD-10-CM | POA: Diagnosis not present

## 2015-12-09 DIAGNOSIS — M1991 Primary osteoarthritis, unspecified site: Secondary | ICD-10-CM | POA: Diagnosis not present

## 2015-12-09 DIAGNOSIS — M25551 Pain in right hip: Secondary | ICD-10-CM | POA: Diagnosis not present

## 2015-12-09 DIAGNOSIS — I10 Essential (primary) hypertension: Secondary | ICD-10-CM | POA: Diagnosis not present

## 2015-12-09 DIAGNOSIS — E785 Hyperlipidemia, unspecified: Secondary | ICD-10-CM | POA: Diagnosis not present

## 2015-12-09 DIAGNOSIS — M4310 Spondylolisthesis, site unspecified: Secondary | ICD-10-CM | POA: Diagnosis not present

## 2015-12-09 DIAGNOSIS — Z9181 History of falling: Secondary | ICD-10-CM | POA: Diagnosis not present

## 2015-12-09 DIAGNOSIS — F0390 Unspecified dementia without behavioral disturbance: Secondary | ICD-10-CM | POA: Diagnosis not present

## 2015-12-09 DIAGNOSIS — Z8673 Personal history of transient ischemic attack (TIA), and cerebral infarction without residual deficits: Secondary | ICD-10-CM | POA: Diagnosis not present

## 2015-12-10 DIAGNOSIS — G301 Alzheimer's disease with late onset: Secondary | ICD-10-CM | POA: Diagnosis not present

## 2015-12-10 DIAGNOSIS — Z8673 Personal history of transient ischemic attack (TIA), and cerebral infarction without residual deficits: Secondary | ICD-10-CM | POA: Diagnosis not present

## 2015-12-10 DIAGNOSIS — Z9181 History of falling: Secondary | ICD-10-CM | POA: Diagnosis not present

## 2015-12-10 DIAGNOSIS — E785 Hyperlipidemia, unspecified: Secondary | ICD-10-CM | POA: Diagnosis not present

## 2015-12-10 DIAGNOSIS — F0390 Unspecified dementia without behavioral disturbance: Secondary | ICD-10-CM | POA: Diagnosis not present

## 2015-12-10 DIAGNOSIS — M25551 Pain in right hip: Secondary | ICD-10-CM | POA: Diagnosis not present

## 2015-12-10 DIAGNOSIS — I10 Essential (primary) hypertension: Secondary | ICD-10-CM | POA: Diagnosis not present

## 2015-12-10 DIAGNOSIS — F0280 Dementia in other diseases classified elsewhere without behavioral disturbance: Secondary | ICD-10-CM | POA: Diagnosis not present

## 2015-12-10 DIAGNOSIS — M1991 Primary osteoarthritis, unspecified site: Secondary | ICD-10-CM | POA: Diagnosis not present

## 2015-12-10 DIAGNOSIS — M4310 Spondylolisthesis, site unspecified: Secondary | ICD-10-CM | POA: Diagnosis not present

## 2015-12-10 DIAGNOSIS — J449 Chronic obstructive pulmonary disease, unspecified: Secondary | ICD-10-CM | POA: Diagnosis not present

## 2015-12-11 DIAGNOSIS — F0390 Unspecified dementia without behavioral disturbance: Secondary | ICD-10-CM | POA: Diagnosis not present

## 2015-12-11 DIAGNOSIS — E785 Hyperlipidemia, unspecified: Secondary | ICD-10-CM | POA: Diagnosis not present

## 2015-12-11 DIAGNOSIS — J449 Chronic obstructive pulmonary disease, unspecified: Secondary | ICD-10-CM | POA: Diagnosis not present

## 2015-12-11 DIAGNOSIS — Z8673 Personal history of transient ischemic attack (TIA), and cerebral infarction without residual deficits: Secondary | ICD-10-CM | POA: Diagnosis not present

## 2015-12-11 DIAGNOSIS — I10 Essential (primary) hypertension: Secondary | ICD-10-CM | POA: Diagnosis not present

## 2015-12-11 DIAGNOSIS — M4310 Spondylolisthesis, site unspecified: Secondary | ICD-10-CM | POA: Diagnosis not present

## 2015-12-11 DIAGNOSIS — Z9181 History of falling: Secondary | ICD-10-CM | POA: Diagnosis not present

## 2015-12-11 DIAGNOSIS — M1991 Primary osteoarthritis, unspecified site: Secondary | ICD-10-CM | POA: Diagnosis not present

## 2015-12-11 DIAGNOSIS — M25551 Pain in right hip: Secondary | ICD-10-CM | POA: Diagnosis not present

## 2015-12-12 DIAGNOSIS — M1991 Primary osteoarthritis, unspecified site: Secondary | ICD-10-CM | POA: Diagnosis not present

## 2015-12-12 DIAGNOSIS — Z9181 History of falling: Secondary | ICD-10-CM | POA: Diagnosis not present

## 2015-12-12 DIAGNOSIS — F0390 Unspecified dementia without behavioral disturbance: Secondary | ICD-10-CM | POA: Diagnosis not present

## 2015-12-12 DIAGNOSIS — E785 Hyperlipidemia, unspecified: Secondary | ICD-10-CM | POA: Diagnosis not present

## 2015-12-12 DIAGNOSIS — M4310 Spondylolisthesis, site unspecified: Secondary | ICD-10-CM | POA: Diagnosis not present

## 2015-12-12 DIAGNOSIS — I10 Essential (primary) hypertension: Secondary | ICD-10-CM | POA: Diagnosis not present

## 2015-12-12 DIAGNOSIS — M25551 Pain in right hip: Secondary | ICD-10-CM | POA: Diagnosis not present

## 2015-12-12 DIAGNOSIS — Z8673 Personal history of transient ischemic attack (TIA), and cerebral infarction without residual deficits: Secondary | ICD-10-CM | POA: Diagnosis not present

## 2015-12-12 DIAGNOSIS — J449 Chronic obstructive pulmonary disease, unspecified: Secondary | ICD-10-CM | POA: Diagnosis not present

## 2015-12-15 DIAGNOSIS — M1991 Primary osteoarthritis, unspecified site: Secondary | ICD-10-CM | POA: Diagnosis not present

## 2015-12-15 DIAGNOSIS — I10 Essential (primary) hypertension: Secondary | ICD-10-CM | POA: Diagnosis not present

## 2015-12-15 DIAGNOSIS — M25551 Pain in right hip: Secondary | ICD-10-CM | POA: Diagnosis not present

## 2015-12-15 DIAGNOSIS — Z9181 History of falling: Secondary | ICD-10-CM | POA: Diagnosis not present

## 2015-12-15 DIAGNOSIS — F0390 Unspecified dementia without behavioral disturbance: Secondary | ICD-10-CM | POA: Diagnosis not present

## 2015-12-15 DIAGNOSIS — E785 Hyperlipidemia, unspecified: Secondary | ICD-10-CM | POA: Diagnosis not present

## 2015-12-15 DIAGNOSIS — Z8673 Personal history of transient ischemic attack (TIA), and cerebral infarction without residual deficits: Secondary | ICD-10-CM | POA: Diagnosis not present

## 2015-12-15 DIAGNOSIS — M4310 Spondylolisthesis, site unspecified: Secondary | ICD-10-CM | POA: Diagnosis not present

## 2015-12-15 DIAGNOSIS — J449 Chronic obstructive pulmonary disease, unspecified: Secondary | ICD-10-CM | POA: Diagnosis not present

## 2015-12-16 DIAGNOSIS — J449 Chronic obstructive pulmonary disease, unspecified: Secondary | ICD-10-CM | POA: Diagnosis not present

## 2015-12-16 DIAGNOSIS — Z8673 Personal history of transient ischemic attack (TIA), and cerebral infarction without residual deficits: Secondary | ICD-10-CM | POA: Diagnosis not present

## 2015-12-16 DIAGNOSIS — F0390 Unspecified dementia without behavioral disturbance: Secondary | ICD-10-CM | POA: Diagnosis not present

## 2015-12-16 DIAGNOSIS — I10 Essential (primary) hypertension: Secondary | ICD-10-CM | POA: Diagnosis not present

## 2015-12-16 DIAGNOSIS — M25551 Pain in right hip: Secondary | ICD-10-CM | POA: Diagnosis not present

## 2015-12-16 DIAGNOSIS — M4310 Spondylolisthesis, site unspecified: Secondary | ICD-10-CM | POA: Diagnosis not present

## 2015-12-16 DIAGNOSIS — E785 Hyperlipidemia, unspecified: Secondary | ICD-10-CM | POA: Diagnosis not present

## 2015-12-16 DIAGNOSIS — Z9181 History of falling: Secondary | ICD-10-CM | POA: Diagnosis not present

## 2015-12-16 DIAGNOSIS — M1991 Primary osteoarthritis, unspecified site: Secondary | ICD-10-CM | POA: Diagnosis not present

## 2015-12-17 DIAGNOSIS — M25551 Pain in right hip: Secondary | ICD-10-CM | POA: Diagnosis not present

## 2015-12-17 DIAGNOSIS — E785 Hyperlipidemia, unspecified: Secondary | ICD-10-CM | POA: Diagnosis not present

## 2015-12-17 DIAGNOSIS — Z8673 Personal history of transient ischemic attack (TIA), and cerebral infarction without residual deficits: Secondary | ICD-10-CM | POA: Diagnosis not present

## 2015-12-17 DIAGNOSIS — I10 Essential (primary) hypertension: Secondary | ICD-10-CM | POA: Diagnosis not present

## 2015-12-17 DIAGNOSIS — F0390 Unspecified dementia without behavioral disturbance: Secondary | ICD-10-CM | POA: Diagnosis not present

## 2015-12-17 DIAGNOSIS — Z9181 History of falling: Secondary | ICD-10-CM | POA: Diagnosis not present

## 2015-12-17 DIAGNOSIS — M1991 Primary osteoarthritis, unspecified site: Secondary | ICD-10-CM | POA: Diagnosis not present

## 2015-12-17 DIAGNOSIS — J449 Chronic obstructive pulmonary disease, unspecified: Secondary | ICD-10-CM | POA: Diagnosis not present

## 2015-12-17 DIAGNOSIS — M4310 Spondylolisthesis, site unspecified: Secondary | ICD-10-CM | POA: Diagnosis not present

## 2015-12-18 DIAGNOSIS — Z9181 History of falling: Secondary | ICD-10-CM | POA: Diagnosis not present

## 2015-12-18 DIAGNOSIS — M25551 Pain in right hip: Secondary | ICD-10-CM | POA: Diagnosis not present

## 2015-12-18 DIAGNOSIS — E785 Hyperlipidemia, unspecified: Secondary | ICD-10-CM | POA: Diagnosis not present

## 2015-12-18 DIAGNOSIS — I10 Essential (primary) hypertension: Secondary | ICD-10-CM | POA: Diagnosis not present

## 2015-12-18 DIAGNOSIS — M1991 Primary osteoarthritis, unspecified site: Secondary | ICD-10-CM | POA: Diagnosis not present

## 2015-12-18 DIAGNOSIS — Z8673 Personal history of transient ischemic attack (TIA), and cerebral infarction without residual deficits: Secondary | ICD-10-CM | POA: Diagnosis not present

## 2015-12-18 DIAGNOSIS — F0390 Unspecified dementia without behavioral disturbance: Secondary | ICD-10-CM | POA: Diagnosis not present

## 2015-12-18 DIAGNOSIS — M4310 Spondylolisthesis, site unspecified: Secondary | ICD-10-CM | POA: Diagnosis not present

## 2015-12-18 DIAGNOSIS — J449 Chronic obstructive pulmonary disease, unspecified: Secondary | ICD-10-CM | POA: Diagnosis not present

## 2015-12-22 DIAGNOSIS — E785 Hyperlipidemia, unspecified: Secondary | ICD-10-CM | POA: Diagnosis not present

## 2015-12-22 DIAGNOSIS — M4310 Spondylolisthesis, site unspecified: Secondary | ICD-10-CM | POA: Diagnosis not present

## 2015-12-22 DIAGNOSIS — J449 Chronic obstructive pulmonary disease, unspecified: Secondary | ICD-10-CM | POA: Diagnosis not present

## 2015-12-22 DIAGNOSIS — I10 Essential (primary) hypertension: Secondary | ICD-10-CM | POA: Diagnosis not present

## 2015-12-22 DIAGNOSIS — M1991 Primary osteoarthritis, unspecified site: Secondary | ICD-10-CM | POA: Diagnosis not present

## 2015-12-22 DIAGNOSIS — Z9181 History of falling: Secondary | ICD-10-CM | POA: Diagnosis not present

## 2015-12-22 DIAGNOSIS — F0390 Unspecified dementia without behavioral disturbance: Secondary | ICD-10-CM | POA: Diagnosis not present

## 2015-12-22 DIAGNOSIS — Z8673 Personal history of transient ischemic attack (TIA), and cerebral infarction without residual deficits: Secondary | ICD-10-CM | POA: Diagnosis not present

## 2015-12-22 DIAGNOSIS — M25551 Pain in right hip: Secondary | ICD-10-CM | POA: Diagnosis not present

## 2015-12-23 DIAGNOSIS — F0390 Unspecified dementia without behavioral disturbance: Secondary | ICD-10-CM | POA: Diagnosis not present

## 2015-12-23 DIAGNOSIS — J449 Chronic obstructive pulmonary disease, unspecified: Secondary | ICD-10-CM | POA: Diagnosis not present

## 2015-12-23 DIAGNOSIS — E785 Hyperlipidemia, unspecified: Secondary | ICD-10-CM | POA: Diagnosis not present

## 2015-12-23 DIAGNOSIS — Z9181 History of falling: Secondary | ICD-10-CM | POA: Diagnosis not present

## 2015-12-23 DIAGNOSIS — M25551 Pain in right hip: Secondary | ICD-10-CM | POA: Diagnosis not present

## 2015-12-23 DIAGNOSIS — M1991 Primary osteoarthritis, unspecified site: Secondary | ICD-10-CM | POA: Diagnosis not present

## 2015-12-23 DIAGNOSIS — M4310 Spondylolisthesis, site unspecified: Secondary | ICD-10-CM | POA: Diagnosis not present

## 2015-12-23 DIAGNOSIS — I10 Essential (primary) hypertension: Secondary | ICD-10-CM | POA: Diagnosis not present

## 2015-12-23 DIAGNOSIS — Z8673 Personal history of transient ischemic attack (TIA), and cerebral infarction without residual deficits: Secondary | ICD-10-CM | POA: Diagnosis not present

## 2015-12-25 DIAGNOSIS — M4310 Spondylolisthesis, site unspecified: Secondary | ICD-10-CM | POA: Diagnosis not present

## 2015-12-25 DIAGNOSIS — J449 Chronic obstructive pulmonary disease, unspecified: Secondary | ICD-10-CM | POA: Diagnosis not present

## 2015-12-25 DIAGNOSIS — E785 Hyperlipidemia, unspecified: Secondary | ICD-10-CM | POA: Diagnosis not present

## 2015-12-25 DIAGNOSIS — M25551 Pain in right hip: Secondary | ICD-10-CM | POA: Diagnosis not present

## 2015-12-25 DIAGNOSIS — M1991 Primary osteoarthritis, unspecified site: Secondary | ICD-10-CM | POA: Diagnosis not present

## 2015-12-25 DIAGNOSIS — I10 Essential (primary) hypertension: Secondary | ICD-10-CM | POA: Diagnosis not present

## 2015-12-25 DIAGNOSIS — F0390 Unspecified dementia without behavioral disturbance: Secondary | ICD-10-CM | POA: Diagnosis not present

## 2015-12-25 DIAGNOSIS — Z9181 History of falling: Secondary | ICD-10-CM | POA: Diagnosis not present

## 2015-12-25 DIAGNOSIS — Z8673 Personal history of transient ischemic attack (TIA), and cerebral infarction without residual deficits: Secondary | ICD-10-CM | POA: Diagnosis not present

## 2015-12-26 DIAGNOSIS — J449 Chronic obstructive pulmonary disease, unspecified: Secondary | ICD-10-CM | POA: Diagnosis not present

## 2015-12-26 DIAGNOSIS — M25551 Pain in right hip: Secondary | ICD-10-CM | POA: Diagnosis not present

## 2015-12-26 DIAGNOSIS — Z8673 Personal history of transient ischemic attack (TIA), and cerebral infarction without residual deficits: Secondary | ICD-10-CM | POA: Diagnosis not present

## 2015-12-26 DIAGNOSIS — M4310 Spondylolisthesis, site unspecified: Secondary | ICD-10-CM | POA: Diagnosis not present

## 2015-12-26 DIAGNOSIS — I10 Essential (primary) hypertension: Secondary | ICD-10-CM | POA: Diagnosis not present

## 2015-12-26 DIAGNOSIS — M1991 Primary osteoarthritis, unspecified site: Secondary | ICD-10-CM | POA: Diagnosis not present

## 2015-12-26 DIAGNOSIS — F0390 Unspecified dementia without behavioral disturbance: Secondary | ICD-10-CM | POA: Diagnosis not present

## 2015-12-26 DIAGNOSIS — Z9181 History of falling: Secondary | ICD-10-CM | POA: Diagnosis not present

## 2015-12-26 DIAGNOSIS — E785 Hyperlipidemia, unspecified: Secondary | ICD-10-CM | POA: Diagnosis not present

## 2016-01-01 DIAGNOSIS — F0390 Unspecified dementia without behavioral disturbance: Secondary | ICD-10-CM | POA: Diagnosis not present

## 2016-01-01 DIAGNOSIS — M545 Low back pain: Secondary | ICD-10-CM | POA: Diagnosis not present

## 2016-01-01 DIAGNOSIS — G8929 Other chronic pain: Secondary | ICD-10-CM | POA: Diagnosis not present

## 2016-01-01 DIAGNOSIS — M1991 Primary osteoarthritis, unspecified site: Secondary | ICD-10-CM | POA: Diagnosis not present

## 2016-01-01 DIAGNOSIS — E785 Hyperlipidemia, unspecified: Secondary | ICD-10-CM | POA: Diagnosis not present

## 2016-01-01 DIAGNOSIS — R296 Repeated falls: Secondary | ICD-10-CM | POA: Diagnosis not present

## 2016-01-01 DIAGNOSIS — J449 Chronic obstructive pulmonary disease, unspecified: Secondary | ICD-10-CM | POA: Diagnosis not present

## 2016-01-01 DIAGNOSIS — I10 Essential (primary) hypertension: Secondary | ICD-10-CM | POA: Diagnosis not present

## 2016-01-01 DIAGNOSIS — M4310 Spondylolisthesis, site unspecified: Secondary | ICD-10-CM | POA: Diagnosis not present

## 2016-01-06 DIAGNOSIS — F0390 Unspecified dementia without behavioral disturbance: Secondary | ICD-10-CM | POA: Diagnosis not present

## 2016-01-06 DIAGNOSIS — J449 Chronic obstructive pulmonary disease, unspecified: Secondary | ICD-10-CM | POA: Diagnosis not present

## 2016-01-06 DIAGNOSIS — M4310 Spondylolisthesis, site unspecified: Secondary | ICD-10-CM | POA: Diagnosis not present

## 2016-01-06 DIAGNOSIS — R296 Repeated falls: Secondary | ICD-10-CM | POA: Diagnosis not present

## 2016-01-06 DIAGNOSIS — E785 Hyperlipidemia, unspecified: Secondary | ICD-10-CM | POA: Diagnosis not present

## 2016-01-06 DIAGNOSIS — I10 Essential (primary) hypertension: Secondary | ICD-10-CM | POA: Diagnosis not present

## 2016-01-06 DIAGNOSIS — G8929 Other chronic pain: Secondary | ICD-10-CM | POA: Diagnosis not present

## 2016-01-06 DIAGNOSIS — M1991 Primary osteoarthritis, unspecified site: Secondary | ICD-10-CM | POA: Diagnosis not present

## 2016-01-06 DIAGNOSIS — M545 Low back pain: Secondary | ICD-10-CM | POA: Diagnosis not present

## 2016-01-08 DIAGNOSIS — E785 Hyperlipidemia, unspecified: Secondary | ICD-10-CM | POA: Diagnosis not present

## 2016-01-08 DIAGNOSIS — M4310 Spondylolisthesis, site unspecified: Secondary | ICD-10-CM | POA: Diagnosis not present

## 2016-01-08 DIAGNOSIS — G8929 Other chronic pain: Secondary | ICD-10-CM | POA: Diagnosis not present

## 2016-01-08 DIAGNOSIS — M545 Low back pain: Secondary | ICD-10-CM | POA: Diagnosis not present

## 2016-01-08 DIAGNOSIS — J449 Chronic obstructive pulmonary disease, unspecified: Secondary | ICD-10-CM | POA: Diagnosis not present

## 2016-01-08 DIAGNOSIS — F0390 Unspecified dementia without behavioral disturbance: Secondary | ICD-10-CM | POA: Diagnosis not present

## 2016-01-08 DIAGNOSIS — R296 Repeated falls: Secondary | ICD-10-CM | POA: Diagnosis not present

## 2016-01-08 DIAGNOSIS — M1991 Primary osteoarthritis, unspecified site: Secondary | ICD-10-CM | POA: Diagnosis not present

## 2016-01-08 DIAGNOSIS — I10 Essential (primary) hypertension: Secondary | ICD-10-CM | POA: Diagnosis not present

## 2016-01-13 DIAGNOSIS — F0390 Unspecified dementia without behavioral disturbance: Secondary | ICD-10-CM | POA: Diagnosis not present

## 2016-01-13 DIAGNOSIS — I10 Essential (primary) hypertension: Secondary | ICD-10-CM | POA: Diagnosis not present

## 2016-01-13 DIAGNOSIS — G8929 Other chronic pain: Secondary | ICD-10-CM | POA: Diagnosis not present

## 2016-01-13 DIAGNOSIS — M4310 Spondylolisthesis, site unspecified: Secondary | ICD-10-CM | POA: Diagnosis not present

## 2016-01-13 DIAGNOSIS — E785 Hyperlipidemia, unspecified: Secondary | ICD-10-CM | POA: Diagnosis not present

## 2016-01-13 DIAGNOSIS — J449 Chronic obstructive pulmonary disease, unspecified: Secondary | ICD-10-CM | POA: Diagnosis not present

## 2016-01-13 DIAGNOSIS — M545 Low back pain: Secondary | ICD-10-CM | POA: Diagnosis not present

## 2016-01-13 DIAGNOSIS — M1991 Primary osteoarthritis, unspecified site: Secondary | ICD-10-CM | POA: Diagnosis not present

## 2016-01-13 DIAGNOSIS — R296 Repeated falls: Secondary | ICD-10-CM | POA: Diagnosis not present

## 2016-01-15 DIAGNOSIS — M1991 Primary osteoarthritis, unspecified site: Secondary | ICD-10-CM | POA: Diagnosis not present

## 2016-01-15 DIAGNOSIS — M545 Low back pain: Secondary | ICD-10-CM | POA: Diagnosis not present

## 2016-01-15 DIAGNOSIS — G8929 Other chronic pain: Secondary | ICD-10-CM | POA: Diagnosis not present

## 2016-01-15 DIAGNOSIS — I10 Essential (primary) hypertension: Secondary | ICD-10-CM | POA: Diagnosis not present

## 2016-01-15 DIAGNOSIS — F0390 Unspecified dementia without behavioral disturbance: Secondary | ICD-10-CM | POA: Diagnosis not present

## 2016-01-15 DIAGNOSIS — R296 Repeated falls: Secondary | ICD-10-CM | POA: Diagnosis not present

## 2016-01-15 DIAGNOSIS — M4310 Spondylolisthesis, site unspecified: Secondary | ICD-10-CM | POA: Diagnosis not present

## 2016-01-15 DIAGNOSIS — E785 Hyperlipidemia, unspecified: Secondary | ICD-10-CM | POA: Diagnosis not present

## 2016-01-15 DIAGNOSIS — J449 Chronic obstructive pulmonary disease, unspecified: Secondary | ICD-10-CM | POA: Diagnosis not present

## 2016-01-20 DIAGNOSIS — J449 Chronic obstructive pulmonary disease, unspecified: Secondary | ICD-10-CM | POA: Diagnosis not present

## 2016-01-20 DIAGNOSIS — I10 Essential (primary) hypertension: Secondary | ICD-10-CM | POA: Diagnosis not present

## 2016-01-20 DIAGNOSIS — G8929 Other chronic pain: Secondary | ICD-10-CM | POA: Diagnosis not present

## 2016-01-20 DIAGNOSIS — E785 Hyperlipidemia, unspecified: Secondary | ICD-10-CM | POA: Diagnosis not present

## 2016-01-20 DIAGNOSIS — R296 Repeated falls: Secondary | ICD-10-CM | POA: Diagnosis not present

## 2016-01-20 DIAGNOSIS — F0390 Unspecified dementia without behavioral disturbance: Secondary | ICD-10-CM | POA: Diagnosis not present

## 2016-01-20 DIAGNOSIS — M4310 Spondylolisthesis, site unspecified: Secondary | ICD-10-CM | POA: Diagnosis not present

## 2016-01-20 DIAGNOSIS — M1991 Primary osteoarthritis, unspecified site: Secondary | ICD-10-CM | POA: Diagnosis not present

## 2016-01-20 DIAGNOSIS — M545 Low back pain: Secondary | ICD-10-CM | POA: Diagnosis not present

## 2016-01-22 DIAGNOSIS — M1991 Primary osteoarthritis, unspecified site: Secondary | ICD-10-CM | POA: Diagnosis not present

## 2016-01-22 DIAGNOSIS — J449 Chronic obstructive pulmonary disease, unspecified: Secondary | ICD-10-CM | POA: Diagnosis not present

## 2016-01-22 DIAGNOSIS — E785 Hyperlipidemia, unspecified: Secondary | ICD-10-CM | POA: Diagnosis not present

## 2016-01-22 DIAGNOSIS — M545 Low back pain: Secondary | ICD-10-CM | POA: Diagnosis not present

## 2016-01-22 DIAGNOSIS — G8929 Other chronic pain: Secondary | ICD-10-CM | POA: Diagnosis not present

## 2016-01-22 DIAGNOSIS — M4310 Spondylolisthesis, site unspecified: Secondary | ICD-10-CM | POA: Diagnosis not present

## 2016-01-22 DIAGNOSIS — I10 Essential (primary) hypertension: Secondary | ICD-10-CM | POA: Diagnosis not present

## 2016-01-22 DIAGNOSIS — R296 Repeated falls: Secondary | ICD-10-CM | POA: Diagnosis not present

## 2016-01-22 DIAGNOSIS — F0390 Unspecified dementia without behavioral disturbance: Secondary | ICD-10-CM | POA: Diagnosis not present

## 2016-04-14 DIAGNOSIS — F0280 Dementia in other diseases classified elsewhere without behavioral disturbance: Secondary | ICD-10-CM | POA: Diagnosis not present

## 2016-04-14 DIAGNOSIS — G301 Alzheimer's disease with late onset: Secondary | ICD-10-CM | POA: Diagnosis not present

## 2016-04-14 DIAGNOSIS — R350 Frequency of micturition: Secondary | ICD-10-CM | POA: Diagnosis not present

## 2016-04-14 DIAGNOSIS — Z720 Tobacco use: Secondary | ICD-10-CM | POA: Diagnosis not present

## 2016-04-14 DIAGNOSIS — J449 Chronic obstructive pulmonary disease, unspecified: Secondary | ICD-10-CM | POA: Diagnosis not present

## 2016-04-14 DIAGNOSIS — I10 Essential (primary) hypertension: Secondary | ICD-10-CM | POA: Diagnosis not present

## 2016-04-20 DIAGNOSIS — E538 Deficiency of other specified B group vitamins: Secondary | ICD-10-CM | POA: Diagnosis not present

## 2016-04-29 DIAGNOSIS — N390 Urinary tract infection, site not specified: Secondary | ICD-10-CM | POA: Diagnosis not present

## 2016-04-29 DIAGNOSIS — G301 Alzheimer's disease with late onset: Secondary | ICD-10-CM | POA: Diagnosis not present

## 2016-04-29 DIAGNOSIS — R0602 Shortness of breath: Secondary | ICD-10-CM | POA: Diagnosis not present

## 2016-04-29 DIAGNOSIS — E538 Deficiency of other specified B group vitamins: Secondary | ICD-10-CM | POA: Diagnosis not present

## 2016-04-29 DIAGNOSIS — J441 Chronic obstructive pulmonary disease with (acute) exacerbation: Secondary | ICD-10-CM | POA: Diagnosis not present

## 2016-04-29 DIAGNOSIS — Z720 Tobacco use: Secondary | ICD-10-CM | POA: Diagnosis not present

## 2016-04-29 DIAGNOSIS — R0902 Hypoxemia: Secondary | ICD-10-CM | POA: Diagnosis not present

## 2016-04-29 DIAGNOSIS — R05 Cough: Secondary | ICD-10-CM | POA: Diagnosis not present

## 2016-04-29 DIAGNOSIS — R296 Repeated falls: Secondary | ICD-10-CM | POA: Diagnosis not present

## 2016-04-29 DIAGNOSIS — R319 Hematuria, unspecified: Secondary | ICD-10-CM | POA: Diagnosis not present

## 2016-04-29 DIAGNOSIS — F419 Anxiety disorder, unspecified: Secondary | ICD-10-CM | POA: Diagnosis not present

## 2016-04-30 DIAGNOSIS — R319 Hematuria, unspecified: Secondary | ICD-10-CM | POA: Diagnosis not present

## 2016-05-01 DIAGNOSIS — G301 Alzheimer's disease with late onset: Secondary | ICD-10-CM | POA: Diagnosis not present

## 2016-05-01 DIAGNOSIS — I10 Essential (primary) hypertension: Secondary | ICD-10-CM | POA: Diagnosis not present

## 2016-05-01 DIAGNOSIS — N390 Urinary tract infection, site not specified: Secondary | ICD-10-CM | POA: Diagnosis not present

## 2016-05-01 DIAGNOSIS — L89312 Pressure ulcer of right buttock, stage 2: Secondary | ICD-10-CM | POA: Diagnosis not present

## 2016-05-01 DIAGNOSIS — J189 Pneumonia, unspecified organism: Secondary | ICD-10-CM | POA: Diagnosis not present

## 2016-05-01 DIAGNOSIS — J440 Chronic obstructive pulmonary disease with acute lower respiratory infection: Secondary | ICD-10-CM | POA: Diagnosis not present

## 2016-05-01 DIAGNOSIS — D649 Anemia, unspecified: Secondary | ICD-10-CM | POA: Diagnosis not present

## 2016-05-01 DIAGNOSIS — F0280 Dementia in other diseases classified elsewhere without behavioral disturbance: Secondary | ICD-10-CM | POA: Diagnosis not present

## 2016-05-01 DIAGNOSIS — M6281 Muscle weakness (generalized): Secondary | ICD-10-CM | POA: Diagnosis not present

## 2016-05-03 DIAGNOSIS — F0280 Dementia in other diseases classified elsewhere without behavioral disturbance: Secondary | ICD-10-CM | POA: Diagnosis not present

## 2016-05-03 DIAGNOSIS — G301 Alzheimer's disease with late onset: Secondary | ICD-10-CM | POA: Diagnosis not present

## 2016-05-03 DIAGNOSIS — I10 Essential (primary) hypertension: Secondary | ICD-10-CM | POA: Diagnosis not present

## 2016-05-03 DIAGNOSIS — D649 Anemia, unspecified: Secondary | ICD-10-CM | POA: Diagnosis not present

## 2016-05-03 DIAGNOSIS — L89312 Pressure ulcer of right buttock, stage 2: Secondary | ICD-10-CM | POA: Diagnosis not present

## 2016-05-03 DIAGNOSIS — J440 Chronic obstructive pulmonary disease with acute lower respiratory infection: Secondary | ICD-10-CM | POA: Diagnosis not present

## 2016-05-03 DIAGNOSIS — J189 Pneumonia, unspecified organism: Secondary | ICD-10-CM | POA: Diagnosis not present

## 2016-05-03 DIAGNOSIS — N390 Urinary tract infection, site not specified: Secondary | ICD-10-CM | POA: Diagnosis not present

## 2016-05-03 DIAGNOSIS — M6281 Muscle weakness (generalized): Secondary | ICD-10-CM | POA: Diagnosis not present

## 2016-05-04 ENCOUNTER — Other Ambulatory Visit: Payer: Self-pay | Admitting: Physician Assistant

## 2016-05-04 DIAGNOSIS — M6281 Muscle weakness (generalized): Secondary | ICD-10-CM | POA: Diagnosis not present

## 2016-05-04 DIAGNOSIS — R319 Hematuria, unspecified: Secondary | ICD-10-CM

## 2016-05-04 DIAGNOSIS — J440 Chronic obstructive pulmonary disease with acute lower respiratory infection: Secondary | ICD-10-CM | POA: Diagnosis not present

## 2016-05-04 DIAGNOSIS — I10 Essential (primary) hypertension: Secondary | ICD-10-CM | POA: Diagnosis not present

## 2016-05-04 DIAGNOSIS — L89312 Pressure ulcer of right buttock, stage 2: Secondary | ICD-10-CM | POA: Diagnosis not present

## 2016-05-04 DIAGNOSIS — J449 Chronic obstructive pulmonary disease, unspecified: Secondary | ICD-10-CM | POA: Diagnosis not present

## 2016-05-04 DIAGNOSIS — J189 Pneumonia, unspecified organism: Secondary | ICD-10-CM | POA: Diagnosis not present

## 2016-05-04 DIAGNOSIS — G301 Alzheimer's disease with late onset: Secondary | ICD-10-CM | POA: Diagnosis not present

## 2016-05-04 DIAGNOSIS — D649 Anemia, unspecified: Secondary | ICD-10-CM | POA: Diagnosis not present

## 2016-05-04 DIAGNOSIS — N390 Urinary tract infection, site not specified: Secondary | ICD-10-CM | POA: Diagnosis not present

## 2016-05-04 DIAGNOSIS — F0280 Dementia in other diseases classified elsewhere without behavioral disturbance: Secondary | ICD-10-CM | POA: Diagnosis not present

## 2016-05-04 DIAGNOSIS — R0602 Shortness of breath: Secondary | ICD-10-CM | POA: Diagnosis not present

## 2016-05-05 DIAGNOSIS — J189 Pneumonia, unspecified organism: Secondary | ICD-10-CM | POA: Diagnosis not present

## 2016-05-05 DIAGNOSIS — G301 Alzheimer's disease with late onset: Secondary | ICD-10-CM | POA: Diagnosis not present

## 2016-05-05 DIAGNOSIS — D649 Anemia, unspecified: Secondary | ICD-10-CM | POA: Diagnosis not present

## 2016-05-05 DIAGNOSIS — I10 Essential (primary) hypertension: Secondary | ICD-10-CM | POA: Diagnosis not present

## 2016-05-05 DIAGNOSIS — N390 Urinary tract infection, site not specified: Secondary | ICD-10-CM | POA: Diagnosis not present

## 2016-05-05 DIAGNOSIS — M6281 Muscle weakness (generalized): Secondary | ICD-10-CM | POA: Diagnosis not present

## 2016-05-05 DIAGNOSIS — L89312 Pressure ulcer of right buttock, stage 2: Secondary | ICD-10-CM | POA: Diagnosis not present

## 2016-05-05 DIAGNOSIS — F0280 Dementia in other diseases classified elsewhere without behavioral disturbance: Secondary | ICD-10-CM | POA: Diagnosis not present

## 2016-05-05 DIAGNOSIS — J440 Chronic obstructive pulmonary disease with acute lower respiratory infection: Secondary | ICD-10-CM | POA: Diagnosis not present

## 2016-05-06 DIAGNOSIS — D649 Anemia, unspecified: Secondary | ICD-10-CM | POA: Diagnosis not present

## 2016-05-06 DIAGNOSIS — N390 Urinary tract infection, site not specified: Secondary | ICD-10-CM | POA: Diagnosis not present

## 2016-05-06 DIAGNOSIS — G301 Alzheimer's disease with late onset: Secondary | ICD-10-CM | POA: Diagnosis not present

## 2016-05-06 DIAGNOSIS — L89312 Pressure ulcer of right buttock, stage 2: Secondary | ICD-10-CM | POA: Diagnosis not present

## 2016-05-06 DIAGNOSIS — M6281 Muscle weakness (generalized): Secondary | ICD-10-CM | POA: Diagnosis not present

## 2016-05-06 DIAGNOSIS — J440 Chronic obstructive pulmonary disease with acute lower respiratory infection: Secondary | ICD-10-CM | POA: Diagnosis not present

## 2016-05-06 DIAGNOSIS — I10 Essential (primary) hypertension: Secondary | ICD-10-CM | POA: Diagnosis not present

## 2016-05-06 DIAGNOSIS — J189 Pneumonia, unspecified organism: Secondary | ICD-10-CM | POA: Diagnosis not present

## 2016-05-06 DIAGNOSIS — F0280 Dementia in other diseases classified elsewhere without behavioral disturbance: Secondary | ICD-10-CM | POA: Diagnosis not present

## 2016-05-07 DIAGNOSIS — G301 Alzheimer's disease with late onset: Secondary | ICD-10-CM | POA: Diagnosis not present

## 2016-05-07 DIAGNOSIS — J189 Pneumonia, unspecified organism: Secondary | ICD-10-CM | POA: Diagnosis not present

## 2016-05-07 DIAGNOSIS — L89312 Pressure ulcer of right buttock, stage 2: Secondary | ICD-10-CM | POA: Diagnosis not present

## 2016-05-07 DIAGNOSIS — D649 Anemia, unspecified: Secondary | ICD-10-CM | POA: Diagnosis not present

## 2016-05-07 DIAGNOSIS — F0280 Dementia in other diseases classified elsewhere without behavioral disturbance: Secondary | ICD-10-CM | POA: Diagnosis not present

## 2016-05-07 DIAGNOSIS — I10 Essential (primary) hypertension: Secondary | ICD-10-CM | POA: Diagnosis not present

## 2016-05-07 DIAGNOSIS — J440 Chronic obstructive pulmonary disease with acute lower respiratory infection: Secondary | ICD-10-CM | POA: Diagnosis not present

## 2016-05-07 DIAGNOSIS — M6281 Muscle weakness (generalized): Secondary | ICD-10-CM | POA: Diagnosis not present

## 2016-05-07 DIAGNOSIS — N390 Urinary tract infection, site not specified: Secondary | ICD-10-CM | POA: Diagnosis not present

## 2016-05-10 DIAGNOSIS — N390 Urinary tract infection, site not specified: Secondary | ICD-10-CM | POA: Diagnosis not present

## 2016-05-10 DIAGNOSIS — I10 Essential (primary) hypertension: Secondary | ICD-10-CM | POA: Diagnosis not present

## 2016-05-10 DIAGNOSIS — J189 Pneumonia, unspecified organism: Secondary | ICD-10-CM | POA: Diagnosis not present

## 2016-05-10 DIAGNOSIS — L89312 Pressure ulcer of right buttock, stage 2: Secondary | ICD-10-CM | POA: Diagnosis not present

## 2016-05-10 DIAGNOSIS — G301 Alzheimer's disease with late onset: Secondary | ICD-10-CM | POA: Diagnosis not present

## 2016-05-10 DIAGNOSIS — F0280 Dementia in other diseases classified elsewhere without behavioral disturbance: Secondary | ICD-10-CM | POA: Diagnosis not present

## 2016-05-10 DIAGNOSIS — D649 Anemia, unspecified: Secondary | ICD-10-CM | POA: Diagnosis not present

## 2016-05-10 DIAGNOSIS — J440 Chronic obstructive pulmonary disease with acute lower respiratory infection: Secondary | ICD-10-CM | POA: Diagnosis not present

## 2016-05-10 DIAGNOSIS — M6281 Muscle weakness (generalized): Secondary | ICD-10-CM | POA: Diagnosis not present

## 2016-05-11 ENCOUNTER — Ambulatory Visit
Admission: RE | Admit: 2016-05-11 | Discharge: 2016-05-11 | Disposition: A | Discharge status: Home / Self Care | Payer: Medicare HMO | Source: Ambulatory Visit | Attending: Physician Assistant | Admitting: Physician Assistant

## 2016-05-11 DIAGNOSIS — R319 Hematuria, unspecified: Secondary | ICD-10-CM | POA: Diagnosis present

## 2016-05-11 DIAGNOSIS — N390 Urinary tract infection, site not specified: Secondary | ICD-10-CM | POA: Diagnosis not present

## 2016-05-11 DIAGNOSIS — J440 Chronic obstructive pulmonary disease with acute lower respiratory infection: Secondary | ICD-10-CM | POA: Diagnosis not present

## 2016-05-11 DIAGNOSIS — R911 Solitary pulmonary nodule: Secondary | ICD-10-CM | POA: Insufficient documentation

## 2016-05-11 DIAGNOSIS — L89312 Pressure ulcer of right buttock, stage 2: Secondary | ICD-10-CM | POA: Diagnosis not present

## 2016-05-11 DIAGNOSIS — G301 Alzheimer's disease with late onset: Secondary | ICD-10-CM | POA: Diagnosis not present

## 2016-05-11 DIAGNOSIS — N200 Calculus of kidney: Secondary | ICD-10-CM | POA: Diagnosis not present

## 2016-05-11 DIAGNOSIS — M6281 Muscle weakness (generalized): Secondary | ICD-10-CM | POA: Diagnosis not present

## 2016-05-11 DIAGNOSIS — F0280 Dementia in other diseases classified elsewhere without behavioral disturbance: Secondary | ICD-10-CM | POA: Diagnosis not present

## 2016-05-11 DIAGNOSIS — J189 Pneumonia, unspecified organism: Secondary | ICD-10-CM | POA: Diagnosis not present

## 2016-05-11 DIAGNOSIS — D649 Anemia, unspecified: Secondary | ICD-10-CM | POA: Diagnosis not present

## 2016-05-11 DIAGNOSIS — I10 Essential (primary) hypertension: Secondary | ICD-10-CM | POA: Diagnosis not present

## 2016-05-11 DIAGNOSIS — M4317 Spondylolisthesis, lumbosacral region: Secondary | ICD-10-CM | POA: Diagnosis not present

## 2016-05-12 DIAGNOSIS — F0280 Dementia in other diseases classified elsewhere without behavioral disturbance: Secondary | ICD-10-CM | POA: Diagnosis not present

## 2016-05-12 DIAGNOSIS — G301 Alzheimer's disease with late onset: Secondary | ICD-10-CM | POA: Diagnosis not present

## 2016-05-12 DIAGNOSIS — L89312 Pressure ulcer of right buttock, stage 2: Secondary | ICD-10-CM | POA: Diagnosis not present

## 2016-05-12 DIAGNOSIS — J189 Pneumonia, unspecified organism: Secondary | ICD-10-CM | POA: Diagnosis not present

## 2016-05-12 DIAGNOSIS — M6281 Muscle weakness (generalized): Secondary | ICD-10-CM | POA: Diagnosis not present

## 2016-05-12 DIAGNOSIS — D649 Anemia, unspecified: Secondary | ICD-10-CM | POA: Diagnosis not present

## 2016-05-12 DIAGNOSIS — N390 Urinary tract infection, site not specified: Secondary | ICD-10-CM | POA: Diagnosis not present

## 2016-05-12 DIAGNOSIS — J440 Chronic obstructive pulmonary disease with acute lower respiratory infection: Secondary | ICD-10-CM | POA: Diagnosis not present

## 2016-05-12 DIAGNOSIS — I10 Essential (primary) hypertension: Secondary | ICD-10-CM | POA: Diagnosis not present

## 2016-05-13 DIAGNOSIS — F0280 Dementia in other diseases classified elsewhere without behavioral disturbance: Secondary | ICD-10-CM | POA: Diagnosis not present

## 2016-05-13 DIAGNOSIS — L89152 Pressure ulcer of sacral region, stage 2: Secondary | ICD-10-CM | POA: Diagnosis not present

## 2016-05-13 DIAGNOSIS — R3129 Other microscopic hematuria: Secondary | ICD-10-CM | POA: Diagnosis not present

## 2016-05-13 DIAGNOSIS — D649 Anemia, unspecified: Secondary | ICD-10-CM | POA: Diagnosis not present

## 2016-05-13 DIAGNOSIS — E538 Deficiency of other specified B group vitamins: Secondary | ICD-10-CM | POA: Diagnosis not present

## 2016-05-13 DIAGNOSIS — G301 Alzheimer's disease with late onset: Secondary | ICD-10-CM | POA: Diagnosis not present

## 2016-05-13 DIAGNOSIS — N390 Urinary tract infection, site not specified: Secondary | ICD-10-CM | POA: Diagnosis not present

## 2016-05-13 DIAGNOSIS — I10 Essential (primary) hypertension: Secondary | ICD-10-CM | POA: Diagnosis not present

## 2016-05-13 DIAGNOSIS — J440 Chronic obstructive pulmonary disease with acute lower respiratory infection: Secondary | ICD-10-CM | POA: Diagnosis not present

## 2016-05-13 DIAGNOSIS — F0281 Dementia in other diseases classified elsewhere with behavioral disturbance: Secondary | ICD-10-CM | POA: Diagnosis not present

## 2016-05-13 DIAGNOSIS — J439 Emphysema, unspecified: Secondary | ICD-10-CM | POA: Diagnosis not present

## 2016-05-13 DIAGNOSIS — N200 Calculus of kidney: Secondary | ICD-10-CM | POA: Diagnosis not present

## 2016-05-13 DIAGNOSIS — M6281 Muscle weakness (generalized): Secondary | ICD-10-CM | POA: Diagnosis not present

## 2016-05-13 DIAGNOSIS — J181 Lobar pneumonia, unspecified organism: Secondary | ICD-10-CM | POA: Diagnosis not present

## 2016-05-13 DIAGNOSIS — J189 Pneumonia, unspecified organism: Secondary | ICD-10-CM | POA: Diagnosis not present

## 2016-05-13 DIAGNOSIS — L89312 Pressure ulcer of right buttock, stage 2: Secondary | ICD-10-CM | POA: Diagnosis not present

## 2016-05-14 ENCOUNTER — Emergency Department
Admission: EM | Admit: 2016-05-14 | Discharge: 2016-05-14 | Disposition: A | Discharge status: Home / Self Care | Payer: Medicare HMO | Attending: Emergency Medicine | Admitting: Emergency Medicine

## 2016-05-14 ENCOUNTER — Emergency Department: Payer: Medicare HMO

## 2016-05-14 DIAGNOSIS — W19XXXA Unspecified fall, initial encounter: Secondary | ICD-10-CM | POA: Diagnosis not present

## 2016-05-14 DIAGNOSIS — Y999 Unspecified external cause status: Secondary | ICD-10-CM | POA: Insufficient documentation

## 2016-05-14 DIAGNOSIS — I10 Essential (primary) hypertension: Secondary | ICD-10-CM | POA: Insufficient documentation

## 2016-05-14 DIAGNOSIS — Y929 Unspecified place or not applicable: Secondary | ICD-10-CM | POA: Diagnosis not present

## 2016-05-14 DIAGNOSIS — M6281 Muscle weakness (generalized): Secondary | ICD-10-CM | POA: Diagnosis not present

## 2016-05-14 DIAGNOSIS — S3992XA Unspecified injury of lower back, initial encounter: Secondary | ICD-10-CM | POA: Diagnosis not present

## 2016-05-14 DIAGNOSIS — Y939 Activity, unspecified: Secondary | ICD-10-CM | POA: Diagnosis not present

## 2016-05-14 DIAGNOSIS — Z7982 Long term (current) use of aspirin: Secondary | ICD-10-CM | POA: Insufficient documentation

## 2016-05-14 DIAGNOSIS — R296 Repeated falls: Secondary | ICD-10-CM | POA: Diagnosis not present

## 2016-05-14 DIAGNOSIS — F0390 Unspecified dementia without behavioral disturbance: Secondary | ICD-10-CM | POA: Diagnosis not present

## 2016-05-14 DIAGNOSIS — S63259A Unspecified dislocation of unspecified finger, initial encounter: Secondary | ICD-10-CM

## 2016-05-14 DIAGNOSIS — R41841 Cognitive communication deficit: Secondary | ICD-10-CM | POA: Diagnosis not present

## 2016-05-14 DIAGNOSIS — R2681 Unsteadiness on feet: Secondary | ICD-10-CM | POA: Diagnosis not present

## 2016-05-14 DIAGNOSIS — Z5189 Encounter for other specified aftercare: Secondary | ICD-10-CM | POA: Diagnosis not present

## 2016-05-14 DIAGNOSIS — F17210 Nicotine dependence, cigarettes, uncomplicated: Secondary | ICD-10-CM | POA: Diagnosis not present

## 2016-05-14 DIAGNOSIS — S60512A Abrasion of left hand, initial encounter: Secondary | ICD-10-CM | POA: Diagnosis not present

## 2016-05-14 DIAGNOSIS — S63283A Dislocation of proximal interphalangeal joint of left middle finger, initial encounter: Secondary | ICD-10-CM | POA: Diagnosis not present

## 2016-05-14 DIAGNOSIS — M25551 Pain in right hip: Secondary | ICD-10-CM | POA: Diagnosis not present

## 2016-05-14 DIAGNOSIS — S63253A Unspecified dislocation of left middle finger, initial encounter: Secondary | ICD-10-CM | POA: Diagnosis not present

## 2016-05-14 DIAGNOSIS — Z8673 Personal history of transient ischemic attack (TIA), and cerebral infarction without residual deficits: Secondary | ICD-10-CM | POA: Diagnosis not present

## 2016-05-14 DIAGNOSIS — T148XXA Other injury of unspecified body region, initial encounter: Secondary | ICD-10-CM

## 2016-05-14 DIAGNOSIS — R1312 Dysphagia, oropharyngeal phase: Secondary | ICD-10-CM | POA: Diagnosis not present

## 2016-05-14 DIAGNOSIS — Z79899 Other long term (current) drug therapy: Secondary | ICD-10-CM | POA: Insufficient documentation

## 2016-05-14 DIAGNOSIS — G9389 Other specified disorders of brain: Secondary | ICD-10-CM | POA: Insufficient documentation

## 2016-05-14 DIAGNOSIS — J449 Chronic obstructive pulmonary disease, unspecified: Secondary | ICD-10-CM | POA: Insufficient documentation

## 2016-05-14 DIAGNOSIS — R9431 Abnormal electrocardiogram [ECG] [EKG]: Secondary | ICD-10-CM | POA: Diagnosis not present

## 2016-05-14 DIAGNOSIS — Z743 Need for continuous supervision: Secondary | ICD-10-CM | POA: Diagnosis not present

## 2016-05-14 DIAGNOSIS — R062 Wheezing: Secondary | ICD-10-CM | POA: Diagnosis not present

## 2016-05-14 DIAGNOSIS — E538 Deficiency of other specified B group vitamins: Secondary | ICD-10-CM | POA: Diagnosis not present

## 2016-05-14 DIAGNOSIS — S0990XA Unspecified injury of head, initial encounter: Secondary | ICD-10-CM | POA: Diagnosis not present

## 2016-05-14 DIAGNOSIS — R6889 Other general symptoms and signs: Secondary | ICD-10-CM | POA: Diagnosis not present

## 2016-05-14 DIAGNOSIS — S6992XA Unspecified injury of left wrist, hand and finger(s), initial encounter: Secondary | ICD-10-CM | POA: Diagnosis present

## 2016-05-14 DIAGNOSIS — M79642 Pain in left hand: Secondary | ICD-10-CM

## 2016-05-14 DIAGNOSIS — N390 Urinary tract infection, site not specified: Secondary | ICD-10-CM | POA: Diagnosis not present

## 2016-05-14 DIAGNOSIS — F329 Major depressive disorder, single episode, unspecified: Secondary | ICD-10-CM | POA: Diagnosis not present

## 2016-05-14 DIAGNOSIS — S299XXA Unspecified injury of thorax, initial encounter: Secondary | ICD-10-CM | POA: Diagnosis not present

## 2016-05-14 LAB — BASIC METABOLIC PANEL
ANION GAP: 8 (ref 5–15)
BUN: 16 mg/dL (ref 6–20)
CALCIUM: 8.7 mg/dL — AB (ref 8.9–10.3)
CO2: 29 mmol/L (ref 22–32)
Chloride: 108 mmol/L (ref 101–111)
Creatinine, Ser: 0.66 mg/dL (ref 0.44–1.00)
GFR calc Af Amer: >60 mL/min (ref >60)
GFR calc non Af Amer: >60 mL/min (ref >60)
GLUCOSE: 113 mg/dL — AB (ref 65–99)
Potassium: 3.5 mmol/L (ref 3.5–5.1)
Sodium: 145 mmol/L (ref 135–145)

## 2016-05-14 LAB — CBC WITH DIFFERENTIAL/PLATELET
BASOS ABS: 0 10*3/uL (ref 0–0.1)
Basophils Relative: 0 %
Eosinophils Absolute: 0.1 10*3/uL (ref 0–0.7)
Eosinophils Relative: 2 %
HEMATOCRIT: 41.4 % (ref 35.0–47.0)
Hemoglobin: 13.9 g/dL (ref 12.0–16.0)
LYMPHS ABS: 1.2 10*3/uL (ref 1.0–3.6)
LYMPHS PCT: 15 %
MCH: 31.7 pg (ref 26.0–34.0)
MCHC: 33.6 g/dL (ref 32.0–36.0)
MCV: 94.4 fL (ref 80.0–100.0)
MONO ABS: 0.7 10*3/uL (ref 0.2–0.9)
MONOS PCT: 10 %
NEUTROS ABS: 5.8 10*3/uL (ref 1.4–6.5)
Neutrophils Relative %: 73 %
Platelets: 269 10*3/uL (ref 150–440)
RBC: 4.39 MIL/uL (ref 3.80–5.20)
RDW: 13.1 % (ref 11.5–14.5)
WBC: 7.9 10*3/uL (ref 3.6–11.0)

## 2016-05-14 LAB — URINALYSIS, COMPLETE (UACMP) WITH MICROSCOPIC
BILIRUBIN URINE: NEGATIVE
Glucose, UA: NEGATIVE mg/dL
KETONES UR: NEGATIVE mg/dL
LEUKOCYTES UA: NEGATIVE
Nitrite: NEGATIVE
PROTEIN: NEGATIVE mg/dL
SQUAMOUS EPITHELIAL / LPF: NONE SEEN
Specific Gravity, Urine: 1.013 (ref 1.005–1.030)
pH: 7 (ref 5.0–8.0)

## 2016-05-14 MED ORDER — CEPHALEXIN 500 MG PO CAPS: 500.0000 mg | ORAL_CAPSULE | Freq: Three times a day (TID) | ORAL | 0 refills | Status: DC

## 2016-05-14 MED ORDER — LORAZEPAM 0.5 MG PO TABS: 0.5000 mg | ORAL_TABLET | Freq: Every day | ORAL | 0 refills | Status: AC

## 2016-05-14 MED ORDER — SODIUM CHLORIDE 0.9 % IV BOLUS (SEPSIS)
500.0000 mL | Freq: Once | INTRAVENOUS | Status: AC
  Administered 2016-05-14: 500 mL via INTRAVENOUS

## 2016-05-14 MED ORDER — CEPHALEXIN 250 MG PO CAPS: 250.0000 mg | ORAL_CAPSULE | Freq: Three times a day (TID) | ORAL | 0 refills | Status: DC

## 2016-05-14 MED ORDER — LIDOCAINE HCL (PF) 1 % IJ SOLN
5.0000 mL | Freq: Once | INTRAMUSCULAR | Status: AC
  Administered 2016-05-14: 5 mL via INTRADERMAL

## 2016-05-14 MED ORDER — ONDANSETRON HCL 4 MG/2ML IJ SOLN
4.0000 mg | Freq: Once | INTRAMUSCULAR | Status: AC
  Administered 2016-05-14: 4 mg via INTRAVENOUS
  Filled 2016-05-14: qty 2

## 2016-05-14 MED ORDER — MORPHINE SULFATE (PF) 2 MG/ML IV SOLN
2.0000 mg | Freq: Once | INTRAVENOUS | Status: AC
  Administered 2016-05-14: 2 mg via INTRAVENOUS
  Filled 2016-05-14: qty 1

## 2016-05-14 MED ORDER — LIDOCAINE HCL (PF) 1 % IJ SOLN
INTRAMUSCULAR | Status: AC
  Administered 2016-05-14: 5 mL via INTRADERMAL
  Filled 2016-05-14: qty 5

## 2016-05-14 MED ORDER — CEFAZOLIN IN D5W 1 GM/50ML IV SOLN
1.0000 g | Freq: Once | INTRAVENOUS | Status: AC
  Administered 2016-05-14: 1 g via INTRAVENOUS
  Filled 2016-05-14: qty 50

## 2016-05-14 NOTE — ED Notes (Signed)
EMS arrived to take pt to [facility]

## 2016-05-14 NOTE — ED Notes (Signed)
Applied pressure relieving sacral bandage per patient/family request with MD approval.

## 2016-05-14 NOTE — NC FL2 (Signed)
  [place] MEDICAID FL2 LEVEL OF CARE SCREENING TOOL     IDENTIFICATION  Patient Name: Allison Sullivan Birthdate: January 15, 1931 Sex: female Admission Date (Current Location): 05/14/2016  [place]   Facility and Address:  [facility], [address], [place], [place] [address]      Provider Number:    Attending Physician Name and Address:  No att. providers found  Relative Name and Phone Number:       Current Level of Care: Hospital Recommended Level of Care: Skilled Nursing Facility, Memory Care Prior Approval Number:    Date Approved/Denied:   PASRR Number:  [id number]   Discharge Plan: SNF    Current Diagnoses: Patient Active Problem List   Diagnosis Date Noted  . Hip pain 10/08/2015    Orientation RESPIRATION BLADDER Height & Weight     Self, Situation  O2 (As a PRN for anxiety) Incontinent Weight: 100 lb (45.4 kg) Height:  5\' 2"  (157.5 cm)  BEHAVIORAL SYMPTOMS/MOOD NEUROLOGICAL BOWEL NUTRITION STATUS      Incontinent Diet (normal)  AMBULATORY STATUS COMMUNICATION OF NEEDS Skin   Supervision Verbally Skin abrasions (Pressure ulcer or buttocks)                       Personal Care Assistance Level of Assistance  Bathing, Feeding, Dressing, Total care Bathing Assistance: Limited assistance Feeding assistance: Independent Dressing Assistance: Limited assistance Total Care Assistance: Limited assistance   Functional Limitations Info  Sight, Hearing, Speech Sight Info: Adequate (Had cataracs) Hearing Info: Adequate Speech Info: Adequate    SPECIAL CARE FACTORS FREQUENCY  PT (By licensed PT), OT (By licensed OT), Speech therapy     PT Frequency: 3x week OT Frequency: 2x week     Speech Therapy Frequency: 1x week      Contractures Contractures Info: Not present    Additional Factors Info                  Current Medications (05/14/2016):  This is the current hospital active  medication list No current facility-administered medications for this encounter.    Current Outpatient Prescriptions  Medication Sig Dispense Refill  . albuterol (PROVENTIL) (2.5 MG/3ML) 0.083% nebulizer solution Take 3 mLs by nebulization every 6 (six) hours as needed for wheezing.    Marland Kitchen. aspirin EC 81 MG tablet Take 81 mg by mouth daily.    . Calcium Carbonate-Vitamin D (CALTRATE 600+D PO) Take 1 tablet by mouth 2 (two) times daily. sporatic    . clonazePAM (KLONOPIN) 0.5 MG tablet Take 0.5 mg by mouth as needed.     . cyanocobalamin (,VITAMIN B-12,) 1000 MCG/ML injection Inject 1,000 mcg into the muscle every 30 (thirty) days.     Marland Kitchen. LORazepam (ATIVAN) 0.5 MG tablet Take 1 tablet by mouth every 8 (eight) hours as needed for anxiety. For up to 10 days    . sertraline (ZOLOFT) 25 MG tablet Take 25 mg by mouth daily.    . cephALEXin (KEFLEX) 250 MG capsule Take 1 capsule (250 mg total) by mouth 3 (three) times daily. 21 capsule 0     Discharge Medications: Please see discharge summary for a list of discharge medications.  Relevant Imaging Results:  Relevant Lab Results:   Additional Information  SSN [id number]  Cheron SchaumannBandi, Racquelle Hyser M, [place]

## 2016-05-14 NOTE — ED Notes (Signed)
[facility]

## 2016-05-14 NOTE — ED Notes (Signed)
Social Work at bedside 

## 2016-05-14 NOTE — Progress Notes (Signed)
LCSW met with patient and daughter and completed initial assessment. Patient has just started to have PT 3x week, OT 2x week,nurses aid for dressing and showering assistance 3x week and nurse 1-2x week all by [facility] home supports.  ( in consultation with EDP- Patient does not require a in patient stay and PT consult has been requested.Patient is insured by [employer]- Patients daughter is unable to private pay but stated her mom is going to need memory care unit.  LCSW will complete assessment, fl2 started and Passr # to be obtained.    [employer] LCSW [phone]

## 2016-05-14 NOTE — ED Notes (Signed)
Social worker, Claudine at bedside to update patient and family.

## 2016-05-14 NOTE — Progress Notes (Signed)
LCSW met with family and discussed insurance [employer]- Daughter willing to have Mom going to any SNF for intensive PT. Daughter reported her Mom was at [facility] in July 2017 and would prefer her there. LCSW consulted with EDP and ED RN.   Called [name] at [employer] and he will review her documentation/authorization and call this worker back  [employer] LCSW [phone]

## 2016-05-14 NOTE — ED Notes (Signed)
Irrigated patient's hand with sterile saline per MD Dolores FrameSung request

## 2016-05-14 NOTE — ED Notes (Signed)
Patient transported to CT 

## 2016-05-14 NOTE — Evaluation (Signed)
Physical Therapy Evaluation Patient Details Name: Allison Sullivan MRN: [id number] DOB: 04/16/1930 Today's Date: 05/14/2016   History of Present Illness  Allison Sullivan is a 83 y.o. female brought to the ED from home via EMS with a chief complaint of unwitnessed fall with left hand injury. Patient lives with her daughter who heard noise and found patient by the floor of her bed. Deformity noted to her left middle finger. Daughter reports patient has recently been on antibiotics for UTI, and most recently finished antibiotics for pneumonia yesterday. Patient was not hospitalized for either of these infections. Reports home health initiated this week. Had a follow-up with her doctor yesterday; being referred to urology for microscopic hematuria. Daughter reports patient had repeat chest xray done at her doctor's office yesterday and they will be notified of results today. Daughter denies fever, chills, chest pain, shortness of breath, abdominal pain, nausea, vomiting, diarrhea, dysuria. Denies recent travel. Nothing makes her symptoms better or worse. Daughter reports tetanus is up-to-date. Daughter reports that patient's function has progressively declined since December 2017. She has been having frequent falls and is unsafe at home  Clinical Impression  Pt admitted with above diagnosis. Pt currently with functional limitations due to the deficits listed below (see PT Problem List).  Pt has been experiencing a functional decline since December 2017 with 6 falls since that time. She has advanced dementia and is AOx1 at time of PT evaluation. All history is obtained from daughter. Pt presents with generalized weakness and poor balance. She requires modA+1 for bed mobility and minA+1 for transfers due to instability and poor safety awareness. Without therapist support pt would have fallen during transfers and ambulation. She is able to ambulate a short distance with rolling walker and minA+1 from therapist for  balance support. She follows approximately 50% of simple commands but does still require heavy verbal/tactile cues for safe mobility. Family has been providing 24/7 support but pt has been increasingly weak and prone to falls over the last 2 months. She would benefit from SNF placement in order to improve safety with transfers/ambulation and decrease fall risk to avoid readmission to hospital. She appears to be able to appropriately participate with therapy on this date. She will likely need higher level of care at baseline once she has completed rehab due to her dementia. Pt will benefit from skilled PT services to address deficits in strength, balance, and mobility in order to return to full function at home.     Follow Up Recommendations SNF    Equipment Recommendations  None recommended by PT    Recommendations for Other Services       Precautions / Restrictions Precautions Precautions: Fall Restrictions Weight Bearing Restrictions: No      Mobility  Bed Mobility Overal bed mobility: Needs Assistance Bed Mobility: Supine to Sit;Sit to Supine     Supine to sit: Mod assist Sit to supine: Mod assist   General bed mobility comments: Pt requires assist with bed mobility secondary to weakness and difficulty following commands.   Transfers Overall transfer level: Needs assistance Equipment used: Rolling walker (2 wheeled) Transfers: Sit to/from Stand Sit to Stand: Min assist         General transfer comment: Pt requires minA+1 for transfers with heavy verbal and tactile cues for sequencing. Poor balance in standing with posterior leaning and inability to correct initially. Pt requires support from PT to keep her from falling  Ambulation/Gait Ambulation/Gait assistance: Min assist Ambulation Distance (Feet): 50 Feet  Assistive device: Rolling walker (2 wheeled) Gait Pattern/deviations: Decreased step length - right;Decreased step length - left Gait velocity: Decreased,  functional for limited household distances Gait velocity interpretation: <1.8 ft/sec, indicative of risk for recurrent falls General Gait Details: Pt requires assist from therapist intermittently for balance with rolling walker. Short shuffling steps with decreased gait speed. Pt requires heavy verbal and tactile cues as well as hand over hand assist for turning walker.  Stairs            Wheelchair Mobility    Modified Rankin (Stroke Patients Only)       Balance Overall balance assessment: Needs assistance Sitting-balance support: No upper extremity supported Sitting balance-Leahy Scale: Fair     Standing balance support: No upper extremity supported Standing balance-Leahy Scale: Poor Standing balance comment: Pt with poor static standing balance intially. Improves with extended duration in standing                             Pertinent Vitals/Pain Pain Assessment: Faces Faces Pain Scale: No hurt Pain Intervention(s): Monitored during session;Other (comment) (Splint on finger)    Home Living Family/patient expects to be discharged to:: Skilled nursing facility Living Arrangements: Children Available Help at Discharge: Family Type of Home: House Home Access: Stairs to enter Entrance Stairs-Rails: Doctor, general practice of Steps: 6 Home Layout: One level Home Equipment: Cane - single point;Walker - 2 wheels;Shower seat (no grab bars, wc, hospital bed. Pt has home O2)      Prior Function Level of Independence: Needs assistance   Gait / Transfers Assistance Needed: Pt is now ambulating limited distances at home with rolling walker. Daughter reports 6 falls since Christmas 2017.   ADL's / Homemaking Assistance Needed: Requires assist for ADLs/IADLs from family. Pt able to feed herself but requires assist with bathing        Hand Dominance   Dominant Hand: Right    Extremity/Trunk Assessment   Upper Extremity Assessment Upper Extremity  Assessment: Generalized weakness    Lower Extremity Assessment Lower Extremity Assessment: Generalized weakness       Communication   Communication: No difficulties  Cognition Arousal/Alertness: Awake/alert Behavior During Therapy: Flat affect Overall Cognitive Status: History of cognitive impairments - at baseline                 General Comments: AOx1. Initially resistant to work with therapy but agrees with encouragement and hand over hand cues. Follows approximately 50% of simple commands from therapist    General Comments      Exercises     Assessment/Plan    PT Assessment Patient needs continued PT services  PT Problem List Decreased strength;Decreased activity tolerance;Decreased balance;Decreased mobility;Decreased cognition;Decreased knowledge of use of DME;Decreased safety awareness          PT Treatment Interventions DME instruction;Gait training;Stair training;Functional mobility training;Therapeutic activities;Therapeutic exercise;Balance training;Neuromuscular re-education;Cognitive remediation;Patient/family education    PT Goals (Current goals can be found in the Care Plan section)  Acute Rehab PT Goals Patient Stated Goal: Improve function and decrease risk for falls PT Goal Formulation: With family Time For Goal Achievement: 05/28/16 Potential to Achieve Goals: Fair    Frequency Min 2X/week   Barriers to discharge Decreased caregiver support Family already providing 24/7 assist and pt still experiencing falls    Co-evaluation               End of Session Equipment Utilized During Treatment: Gait belt Activity  Tolerance: Patient tolerated treatment well Patient left: in bed;with call bell/phone within reach;with family/visitor present Nurse Communication: Other (comment) (Family requesting television remote)    Functional Assessment Tool Used: clinical judgement Functional Limitation: Mobility: Walking and moving around Mobility:  Walking and Moving Around Current Status ([id number](G8978): At least 60 percent but less than 80 percent impaired, limited or restricted Mobility: Walking and Moving Around Goal Status [phone](G8979): At least 20 percent but less than 40 percent impaired, limited or restricted    Time: 1049-1120 PT Time Calculation (min) (ACUTE ONLY): 31 min   Charges:   PT Evaluation $PT Eval Moderate Complexity: 1 Procedure     PT G Codes:   PT G-Codes **NOT FOR INPATIENT CLASS** Functional Assessment Tool Used: clinical judgement Functional Limitation: Mobility: Walking and moving around Mobility: Walking and Moving Around Current Status (U9811(G8978): At least 60 percent but less than 80 percent impaired, limited or restricted Mobility: Walking and Moving Around Goal Status [phone](G8979): At least 20 percent but less than 40 percent impaired, limited or restricted   Lynnea MaizesJason D Stacy Deshler PT, DPT   Cottrell Gentles 05/14/2016, 11:56 AM

## 2016-05-14 NOTE — Progress Notes (Signed)
LCSW received a call from [facility] at [facility]- Patient has been assigned Bedroom # [id number]  Call report to [address] Nurse [phone]  Patient is to be transported by EMS to [facility] ED RN and ED secretary and ED notified.   [facility] Requested hard copy for all pain medications and Adavan this patient will need for 20 day stay.   Delta Air LinesClaudine Quintara Bost LCSW [phone]

## 2016-05-14 NOTE — ED Provider Notes (Signed)
[facility] Emergency Department Provider Note   ____________________________________________   First MD Initiated Contact with Patient 05/14/16 0345     (approximate)  I have reviewed the triage vital signs and the nursing notes.   HISTORY  Chief Complaint Hand Injury (left hand) and Fall    HPI Allison Sullivan is a 83 y.o. female brought to the ED from home via EMS with a chief complaint of unwitnessed fall with left hand injury. Patient lives with her daughter who heard noise and found patient by the floor of her bed. Deformity noted to her left middle finger. Daughter reports patient has recently been on antibiotics for UTI, and most recently finished antibiotics for pneumonia yesterday. Patient was not hospitalized for either of these infections. Reports home health initiated this week.Had a follow-up with her doctor yesterday; being referred to urology for microscopic hematuria. Daughter reports patient had repeat chest xray done at her doctor's office yesterday and they will be notified of results today. Daughter denies fever, chills, chest pain, shortness of breath, abdominal pain, nausea, vomiting, diarrhea, dysuria. Denies recent travel. Nothing makes her symptoms better or worse. Daughter reports tetanus is up-to-date.   Past Medical History:  Diagnosis Date  . COPD (chronic obstructive pulmonary disease) (HCC)    Documented  . Degenerative joint disease    degerative disc disease  . Dementia   . Hyperlipidemia   . Hypertension   . Osteoporosis   . Spondylolisthesis    with foraminal stenosis  . Stroke [facility])    mini stroke-2 yrs ago  . Thyroid disease 1979   Thyroid lesion    Patient Active Problem List   Diagnosis Date Noted  . Hip pain 10/08/2015    Past Surgical History:  Procedure Laterality Date  . ABDOMINAL HYSTERECTOMY  1997  . APPENDECTOMY  1979  . CHOLECYSTECTOMY  2002  . EXCISION OF ADNEXAL MASS Left   . EYE SURGERY  Bilateral    Cataract  . FOOT SURGERY Right   . THYROID SURGERY     thyroid lesion resection  . TRIGGER FINGER RELEASE Left 09/11/2014   Procedure: RELEASE TRIGGER FINGER/A-1 PULLEY;  Surgeon: Christena Flake, MD;  Location: [facility];  Service: Orthopedics;  Laterality: Left;  RISK FOR FALLS    Prior to Admission medications   Medication Sig Start Date End Date Taking? Authorizing Provider  albuterol (PROVENTIL) (2.5 MG/3ML) 0.083% nebulizer solution Take 3 mLs by nebulization every 6 (six) hours as needed for wheezing. 04/29/16 04/29/17 Yes Historical Provider, MD  aspirin EC 81 MG tablet Take 81 mg by mouth daily.   Yes Historical Provider, MD  Calcium Carbonate-Vitamin D (CALTRATE 600+D PO) Take 1 tablet by mouth 2 (two) times daily. sporatic   Yes Historical Provider, MD  clonazePAM (KLONOPIN) 0.5 MG tablet Take 0.5 mg by mouth as needed.  05/10/16  Yes Historical Provider, MD  cyanocobalamin (,VITAMIN B-12,) 1000 MCG/ML injection Inject 1,000 mcg into the muscle every 30 (thirty) days.  04/20/16  Yes Historical Provider, MD  LORazepam (ATIVAN) 0.5 MG tablet Take 1 tablet by mouth every 8 (eight) hours as needed for anxiety. For up to 10 days 05/13/16 05/23/16 Yes Historical Provider, MD  sertraline (ZOLOFT) 25 MG tablet Take 25 mg by mouth daily. 05/13/16 05/13/17 Yes Historical Provider, MD  cephALEXin (KEFLEX) 250 MG capsule Take 1 capsule (250 mg total) by mouth 3 (three) times daily. 05/14/16   Irean Hong, MD    Allergies Patient  has no known allergies.  Family History  Problem Relation Age of Onset  . Heart disease Mother 48    died of attack  . Arthritis Mother   . Hyperlipidemia Mother   . Heart disease Father 28    died of heart attack    Social History Social History  Substance Use Topics  . Smoking status: Current Every Day Smoker    Packs/day: 1.00    Years: 60.00    Types: Cigarettes  . Smokeless tobacco: Never Used  . Alcohol use No    Review of  Systems  Constitutional: No fever/chills. Eyes: No visual changes. ENT: No sore throat. Cardiovascular: Denies chest pain. Respiratory: Denies shortness of breath. Gastrointestinal: No abdominal pain.  No nausea, no vomiting.  No diarrhea.  No constipation. Genitourinary: Negative for dysuria. Musculoskeletal: Positive for left hand pain. Negative for back pain. Skin: Negative for rash. Neurological: Negative for headaches, focal weakness or numbness.  10-point ROS otherwise negative.  ____________________________________________   PHYSICAL EXAM:  VITAL SIGNS: ED Triage Vitals  Enc Vitals Group     BP 05/14/16 0335 (!) 186/69     Pulse Rate 05/14/16 0335 67     Resp 05/14/16 0335 18     Temp 05/14/16 0335 98.7 F (37.1 C)     Temp Source 05/14/16 0335 Oral     SpO2 05/14/16 0335 93 %     Weight 05/14/16 0336 100 lb (45.4 kg)     Height 05/14/16 0336 5\' 2"  (1.575 m)     Head Circumference --      [facility] Flow --      Pain Score --      Pain Loc --      Pain Edu? --      Excl. in GC? --     Constitutional: Alert and oriented. Frail-appearing and in no acute distress. Eyes: Conjunctivae are normal. PERRL. EOMI. Head: Atraumatic. Nose: No congestion/rhinnorhea. Mouth/Throat: Mucous membranes are moist.  Oropharynx non-erythematous. Neck: No stridor.  No cervical spine tenderness to palpation. Cardiovascular: Normal rate, regular rhythm. Grossly normal heart sounds.  Good peripheral circulation. Respiratory: Normal respiratory effort.  No retractions. Lungs CTAB. Gastrointestinal: Soft and nontender. No distention. No abdominal bruits. No CVA tenderness. Musculoskeletal: Pelvis stable. No spinal tenderness to palpation. Stage I sacral decubitus ulcer. Left hand: Obvious deformity of middle digit at proximal IP joint. Small break in skin at joint. Abrasion to medial middle digit. No active bleeding. Fourth and fifth digits with trigger finger deformities which daughter  reports are patient's baseline. Neurologic:  Alert and oriented to person and place. Normal speech and language. No gross focal neurologic deficits are appreciated.  Skin:  Skin is warm, dry and intact. No rash noted. Psychiatric: Mood and affect are normal. Speech and behavior are normal.  ____________________________________________   LABS (all labs ordered are listed, but only abnormal results are displayed)  Labs Reviewed  BASIC METABOLIC PANEL - Abnormal; Notable for the following:       Result Value   Glucose, Bld 113 (*)    Calcium 8.7 (*)    All other components within normal limits  URINALYSIS, COMPLETE (UACMP) WITH MICROSCOPIC - Abnormal; Notable for the following:    Color, Urine YELLOW (*)    APPearance HAZY (*)    Hgb urine dipstick MODERATE (*)    Bacteria, UA RARE (*)    All other components within normal limits  CBC WITH DIFFERENTIAL/PLATELET   ____________________________________________  ED ECG REPORT I,  Caliph Borowiak J, the attending physician, personally viewed and interpreted this ECG.   Date: 05/14/2016  EKG Time: 0347  Rate: 66  Rhythm: normal EKG, normal sinus rhythm  Axis: LAD  Intervals:left anterior fascicular block  ST&T Change: Nonspecific  ____________________________________________  RADIOLOGY  CT head interpreted per Dr. Harrie JeansStratton: 1. No acute intracranial abnormality identified. No displaced  calvarial fracture or scalp hematoma.  2. Moderate chronic microvascular ischemic changes and parenchymal  volume loss of the brain are mildly progressed from 2014.    Left hand complete (viewED by me, interpreted per Dr. Ethelene Halevens): Dislocation of the proximal interphalangeal joint of the left third  finger. Flexion and extension deformities of the left fourth and  fifth fingers most consistent with ligamentous injury.   ____________________________________________   PROCEDURES  Procedure(s) performed: None  Reduction of dislocation Date/Time:  7:09 AM Performed by: Irean HongSUNG,Bulah Lurie J Authorized by: Irean HongSUNG,Breionna Punt J Consent: Verbal consent obtained. Risks and benefits: risks, benefits and alternatives were discussed Consent given by: patient/daughter Required items: required blood products, implants, devices, and special equipment available Time out: Immediately prior to procedure a "time out" was called to verify the correct patient, procedure, equipment, support staff and site/side marked as required.  Patient sedated: None; digital block to left 3rd digit with 1% lidocaine without epi; 6mL injected  Patient tolerance: Patient tolerated the procedure well with no immediate complications. Joint: left 3rd proximal IP joint Reduction technique: standard; click heard on reduction and patient immediately able to bend the joint.    Procedures  Critical Care performed: No  ____________________________________________   INITIAL IMPRESSION / ASSESSMENT AND PLAN / ED COURSE  Pertinent labs & imaging results that were available during my care of the patient were reviewed by me and considered in my medical decision making (see chart for details).  83 year old female who presents status post unwitnessed fall. Given her dementia, will obtain CT head to evaluate for intracranial injury. Patient has an obvious deformity to her left hand; will obtain x-ray. Given her recent illness, will obtain lab work and urinalysis. Hold chest x-ray as patient had one done yesterday at her doctor's office.  Clinical Course as of May 15 735  Fri May 14, 2016  [id number] Patient tolerated left third digit joint reduction well. Prior to reduction she was irrigated thoroughly with 2 L of normal saline for minor open wound to the digit. IV Ancef was given for infection prophylaxis. Post reduction finger was dressed with Xeroform, Kerlix and foam finger splint. Will place on Keflex for prophylaxis and referred to hand surgeon for follow-up. Strict return precautions given.  Patient and daughter verbalized understanding and agree with plan of care.  [JS]  0706 Daughter asking to speak with me due to concern of taking the patient home. Daughter states she has chronic pain issues and feel she is unable to care for the patient. She is interested in placement options. We discussed that, at this time, patient does not meet inpatient criteria. Given her recent pneumonia, and the fact that I am unable to see the chest x-ray results from yesterday, we will repeat chest x-ray as well as image her pelvis. If those are unremarkable, then patient will be seen by clinical social work and physical therapy for consideration of placement. Care transferred to Dr. Mayford KnifeWilliams pending x-ray results and consults.  [JS]    Clinical Course User Index [JS] Irean HongJade J Tobey Schmelzle, MD     ____________________________________________   FINAL CLINICAL IMPRESSION(S) / ED DIAGNOSES  Final diagnoses:  Fall, initial encounter  Abrasion  Left hand pain  Finger dislocation, initial encounter      NEW MEDICATIONS STARTED DURING THIS VISIT:  Current Discharge Medication List    START taking these medications   Details  cephALEXin (KEFLEX) 250 MG capsule Take 1 capsule (250 mg total) by mouth 3 (three) times daily. Qty: 21 capsule, Refills: 0         Note:  This document was prepared using Dragon voice recognition software and may include unintentional dictation errors.    Irean Hong, MD 05/14/16 [phone]

## 2016-05-14 NOTE — Progress Notes (Signed)
Called OT Therapist Consuella Loselaine [phone]- Needs consult before she comes down. LCSW called ED RN    Arrie Senatelaudine Khrystyne Arpin LCSW [phone]

## 2016-05-14 NOTE — ED Notes (Signed)
MD Sung at bedside. 

## 2016-05-14 NOTE — ED Provider Notes (Signed)
-----------------------------------------   4:41 PM on 05/14/2016 -----------------------------------------  Signed out to me at 3 PM as waiting for occupational therapy and physical therapy etc. to review patient for placement at [facility]. This has been done. Social work has repaired transport. Patient in no acute distress. Our saturation 94% when she is sleeping. She did recently get treated for a pneumonia. Her symptoms of pneumonia have resolved and not as I think why she still has a residual change in her chest x-ray but clinically that is not still present. Discharge was arranged by Dr. Dolores FrameSung prior to my arrival. At the request of [facility] I am writing for her when necessary daily at bedtime Ativan. Family very comfortable with this plan and we will discharge.   Jeanmarie PlantJames A Dyland Panuco, MD 05/14/16 [phone]

## 2016-05-14 NOTE — ED Notes (Signed)
OT at bedside. 

## 2016-05-14 NOTE — ED Notes (Signed)
PT finished with eval. Pt slid up in bed by this RN and PT. Family at bedside.

## 2016-05-14 NOTE — ED Triage Notes (Signed)
Per EMS: Patient from home; pt has hx of dementia. Pt fell today and injured left hand. Pt has deformities to 3rd, 4th, and 5th fingers left hand

## 2016-05-14 NOTE — Progress Notes (Signed)
[facility]. LCSW provided him with [facility] and he is going to call for authorization number and then present LCSW with bed offer/call report number in the next little while.  Delta Air LinesClaudine Coraline Talwar LCSW [phone]

## 2016-05-14 NOTE — Discharge Instructions (Signed)
1. Take antibiotic as prescribed (Keflex 250 mg 3 times daily 7 days). 2. Keep wound clean and dry. Have your home health nurse change the bandage. 3. Return to the ER for worsening symptoms, redness/swelling of your finger, purulent discharge, fever or other concerns.

## 2016-05-14 NOTE — Progress Notes (Signed)
LCSW called [employer] and was transferred to [phone]. [facility] to [facility] who explained pt needs a 3 night stay. Was transferred to a different number and spoke to Ara who reported the SNF just has to call to get auth # and she does not require a 3 night stay-Reference # [id number].   Delta Air LinesClaudine Baylynn Shifflett LCSW [phone]

## 2016-05-14 NOTE — ED Notes (Signed)
PT at bedside.

## 2016-05-14 NOTE — Clinical Social Work Note (Signed)
Clinical Social Work Assessment  Patient Details  Name: [place] MRN: [id number] Date of Birth: 04-27-1930  Date of referral:  05/14/16               Reason for consult:  Facility Placement                Permission sought to share information with:  Family Supports, Magazine features editor Permission granted to share information::  Yes, Verbal Permission Granted  Name::     HCPOA Daughter Mason Jim [phone]  Agency::   All facilities  Relationship::     Contact Information:  All facillities  Housing/Transportation Living arrangements for the past 2 months:  Single Family Home Source of Information:  Patient, Adult Children Patient Interpreter Needed:  None Criminal Activity/Legal Involvement Pertinent to Current Situation/Hospitalization:  No - Comment as needed Significant Relationships:  Adult Children Lives with:  Adult Children Do you feel safe going back to the place where you live?  Yes Need for family participation in patient care:  Yes (Comment)  Care giving concerns: Daughter has some health issues her self and is working with [facility] to get things in place for her mom. Her mom has had recent falls which cause an injury to left hand.   Social Worker assessment / plan:  LCSW introduced myself to daughter HCPOA Mason Jim [phone]) and to the patient. Patient is an 83 year old female, who has history of dementia and is oriented x2. Currently she has in home PT 3x week and OT 2 x week and nurse 1x week to assist with the pressure ulcer on her buttocks and speech therapist 1x week and a nurses aid 3x wk to assist with personal hygiene and dressing. Daughter brought mom in due to recent falls and wanted her placed in a memory care unit. LCSW explained that a PT consult has been ordered and [facility]. Patient is on a normal diet, and is incontinent. She was using a cane, then a  walker but needs full supervision. Daughter reports she has serious health issues and feels she cant care for her Mom at this level. Patients insurance is [employer]. It was explained to patient that she does not meet the criteria for inpatient stay or for SNF- STR. Daughter reports she cant do Private pay and understands he Mom might be returning home with all the in home supports. Doctors are reviewing a medical issues and have ordered a PT consult.  Employment status:  Retired Health and safety inspector:  Furniture conservator/restorer) PT Recommendations:  Not assessed at this time (awaiting PT consult) Information / Referral to community resources:  Skilled Nursing Facility  Patient/Family's Response to care:  Daughter has health issues and reports he Mom is weak and has had many falls  Patient/Family's Understanding of and Emotional Response to Diagnosis, Current Treatment, and Prognosis: They understand they have in home supports but would like her in a memory care unit in the future.  Emotional Assessment Appearance:  Appears stated age Attitude/Demeanor/Rapport:  Other (Polite, calm, disoriented) Affect (typically observed):  Calm, Pleasant, Quiet Orientation:  Oriented to Self, Oriented to Place Alcohol / Substance use:  Not Applicable Psych involvement (Current and /or in the community):  No (Comment)  Discharge Needs  Concerns to be addressed:  Care Coordination Readmission within the last 30 days:  No Current discharge risk:    Barriers to Discharge:  Family Issues  Cheron SchaumannBandi, Rawleigh Rode M, LCSW 05/14/2016, 8:33 AM

## 2016-05-14 NOTE — ED Notes (Signed)
MD Dolores FrameSung requested finger be cleaned, dressed and splinted.  Cleaned abrasion/skin tear of left middle phalanx with iodine;  Rinsed iodine from finger with sterile saline; Applied Xeroform gauze to finger; Applied sterile gauze to finger; Applied Aluminum splint to finger per MD instructions/request

## 2016-05-14 NOTE — Evaluation (Signed)
Occupational Therapy Evaluation Patient Details Name: Allison MediciMarie D Murty MRN: [id number] DOB: Feb 12, 1931 Today's Date: 05/14/2016    History of Present Illness Pt. is an 83 y.o. female who was brought to the ED via EMS with a Left hand 3rd digit dislocation injury following an unwitnessed fall at home. Pt. recent PMHx includes: UTI, and Pneumonia which pt. has been on antibiotics for. Pt. PMHx includes: COPD, DJD, Dementia, Hyperlipidemia, Osteoporosis, CVA, and thyroid disease.   Clinical Impression   Pt. Is an 83 y.o. female who was admitted to the ER with a Left hand 3rd digit dislocation. Pt. has 4th and 5th digit limitations as well on her left hand. Pt. Is unable to use her left hand to assist with ADL, and IADL tasks. Pt. presents with weakness, decreased functional mobility, left hand limitations, decreased activity tolerance, and cognitive impairments which hinder her ability to complete basis ADL tasks. Pt. Could benefit from skilled OT services for ADL training, A/E training, UE therapeutic exercise, therapeutic Activity, functional mobility, and pt. education about home modification, and DME. Pt. would be appropriate for SNF level of care with follow-up OT services. Pt. daughter reports she is unable to provide the assist pt. needs at this time secondary to her own pysical health.    Follow Up Recommendations  SNF    Equipment Recommendations       Recommendations for Other Services       Precautions / Restrictions Precautions Precautions: Fall Restrictions Weight Bearing Restrictions: No      Mobility Per PT report pt. requires MinA transfers.  Balance     ADL Overall ADL's : Needs assistance/impaired Eating/Feeding: Set up (Pt. is unable to assist with her left hand. Requires complete set-up secondary to left hand limitations.)   Grooming: Set up (Pt. is unable to assist with her Left hand. Requires complete setup secondary to left hand limitations)                Lower Body Dressing: Maximal assistance                 General ADL Comments: Pt. and family education was provided about assistance with self-care.     Vision     Perception     Praxis      Pertinent Vitals/Pain Pain Assessment: Faces Faces Pain Scale: No hurt Pain Intervention(s): Monitored during session     Hand Dominance Right   Extremity/Trunk Assessment Upper Extremity Assessment Upper Extremity Assessment: Generalized weakness (Left 3rd digit immobilization from current injury. 4th digit limitations, and 5th digit contracture.)       Communication Communication Communication: No difficulties   Cognition Arousal/Alertness: Awake/alert Behavior During Therapy: WFL for tasks assessed/performed Overall Cognitive Status: History of cognitive impairments - at baseline                   General Comments       Exercises       Shoulder Instructions      Home Living Family/patient expects to be discharged to:: Skilled nursing facility Living Arrangements: Children Available Help at Discharge: Family (Daughter resides with pt.) Type of Home: House Home Access: Stairs to enter Secretary/administratorntrance Stairs-Number of Steps: 6 Entrance Stairs-Rails: Right;Left Home Layout: One level     Bathroom Shower/Tub: Chief Strategy OfficerTub/shower unit   Bathroom Toilet: Standard     Home Equipment: Cane - single point;Walker - 2 wheels          Prior Functioning/Environment Level of Independence: Needs assistance  Gait / Transfers Assistance Needed: Pt. daughter reports multiple falls (6) since Christmas of 2017 ADL's / Homemaking Assistance Needed: Pt. required assist with UE ADLs, and extensive assist with LE ADLs. Family assists with all IADLs. Required complete set-up of meals.             OT Problem List: Decreased strength;Pain;Decreased coordination;Decreased range of motion;Decreased activity tolerance   OT Treatment/Interventions: Self-care/ADL  training;Therapeutic exercise;DME and/or AE instruction;Patient/family education;Therapeutic activities    OT Goals(Current goals can be found in the care plan section) Acute Rehab OT Goals Patient Stated Goal: To improve self care independence OT Goal Formulation: With patient Potential to Achieve Goals: Good  OT Frequency: Min 1X/week   Barriers to D/C:            Co-evaluation              End of Session    Activity Tolerance: Patient tolerated treatment well Patient left: in bed;with call bell/phone within reach   Time: 1610-9604 OT Time Calculation (min): 30 min Charges:  OT General Charges $OT Visit: 1 Procedure OT Evaluation $OT Eval Low Complexity: 1 Procedure G-Codes: OT G-codes **NOT FOR INPATIENT CLASS** Functional Assessment Tool Used: clinical judgement based on pt. current functional level.  Functional Limitation: Self care Self Care Current Status [phone]): At least 60 percent but less than 80 percent impaired, limited or restricted Self Care Goal Status (J1914): At least 20 percent but less than 40 percent impaired, limited or restricted  Olegario Messier, MS, OTR/L 05/14/2016, 3:36 PM

## 2016-05-18 DIAGNOSIS — F0390 Unspecified dementia without behavioral disturbance: Secondary | ICD-10-CM | POA: Diagnosis not present

## 2016-05-18 DIAGNOSIS — E538 Deficiency of other specified B group vitamins: Secondary | ICD-10-CM | POA: Diagnosis not present

## 2016-05-18 DIAGNOSIS — R296 Repeated falls: Secondary | ICD-10-CM | POA: Diagnosis not present

## 2016-05-18 DIAGNOSIS — M6281 Muscle weakness (generalized): Secondary | ICD-10-CM | POA: Diagnosis not present

## 2016-05-18 DIAGNOSIS — Z8673 Personal history of transient ischemic attack (TIA), and cerebral infarction without residual deficits: Secondary | ICD-10-CM | POA: Diagnosis not present

## 2016-05-18 DIAGNOSIS — I10 Essential (primary) hypertension: Secondary | ICD-10-CM | POA: Diagnosis not present

## 2016-05-25 ENCOUNTER — Other Ambulatory Visit: Payer: Self-pay

## 2016-05-25 NOTE — Patient Outreach (Signed)
[facility] Care Management  05/25/2016  Allison MediciMarie D [place] 1930-12-20 [id number]    Transition of Care Referral  Referral Date: 05/25/16 [place]erral Source: [place] Date of Discharge: 05/24/16 Facility: [facility] Insurance: [employer]   Outreach attempt # 1 to patient. Spoke with dtr. No PHI given. She freely reported that patient had been discharged from [facility] facility on yesterday and has moved to [facility]. She states that the goal if for patient to stay there permanently as she can no longer continue to provide care for her mother.     Plan: RN CM will notify [facility] administrative assistant of case closure status.    Antionette Fairyoshanda Talayla Doyel, RN,BSN,CCM [facility] Care [facility] Telephonic Care Management Coordinator Direct Phone: [phone] Toll Free: [phone] Fax: [phone]

## 2016-05-26 DIAGNOSIS — Z79899 Other long term (current) drug therapy: Secondary | ICD-10-CM | POA: Diagnosis not present

## 2016-05-26 DIAGNOSIS — M545 Low back pain: Secondary | ICD-10-CM | POA: Diagnosis not present

## 2016-05-26 DIAGNOSIS — G308 Other Alzheimer's disease: Secondary | ICD-10-CM | POA: Diagnosis not present

## 2016-05-26 DIAGNOSIS — N3001 Acute cystitis with hematuria: Secondary | ICD-10-CM | POA: Diagnosis not present

## 2016-05-26 DIAGNOSIS — R350 Frequency of micturition: Secondary | ICD-10-CM | POA: Diagnosis not present

## 2016-05-26 DIAGNOSIS — N209 Urinary calculus, unspecified: Secondary | ICD-10-CM | POA: Diagnosis not present

## 2016-05-26 DIAGNOSIS — R3915 Urgency of urination: Secondary | ICD-10-CM | POA: Diagnosis not present

## 2016-05-26 DIAGNOSIS — J449 Chronic obstructive pulmonary disease, unspecified: Secondary | ICD-10-CM | POA: Diagnosis not present

## 2016-05-26 DIAGNOSIS — Z993 Dependence on wheelchair: Secondary | ICD-10-CM | POA: Diagnosis not present

## 2016-05-31 ENCOUNTER — Ambulatory Visit (INDEPENDENT_AMBULATORY_CARE_PROVIDER_SITE_OTHER): Payer: Medicare HMO | Admitting: Urology

## 2016-05-31 ENCOUNTER — Encounter: Payer: Self-pay | Admitting: Urology

## 2016-05-31 VITALS — BP 117/65 | HR 79 | Ht 59.0 in | Wt 77.4 lb

## 2016-05-31 DIAGNOSIS — N200 Calculus of kidney: Secondary | ICD-10-CM

## 2016-05-31 DIAGNOSIS — R310 Gross hematuria: Secondary | ICD-10-CM

## 2016-05-31 DIAGNOSIS — N952 Postmenopausal atrophic vaginitis: Secondary | ICD-10-CM

## 2016-05-31 LAB — URINALYSIS, COMPLETE
Bilirubin, UA: NEGATIVE
GLUCOSE, UA: NEGATIVE
Nitrite, UA: NEGATIVE
SPEC GRAV UA: 1.02 (ref 1.005–1.030)
UUROB: 1 mg/dL (ref 0.2–1.0)
pH, UA: 8.5 — ABNORMAL HIGH (ref 5.0–7.5)

## 2016-05-31 LAB — MICROSCOPIC EXAMINATION
Bacteria, UA: NONE SEEN
Epithelial Cells (non renal): NONE SEEN /hpf (ref 0–10)
RBC, UA: >30 /hpf — ABNORMAL HIGH (ref 0–?)
WBC, UA: NONE SEEN /hpf (ref 0–?)

## 2016-05-31 MED ORDER — ESTRADIOL 0.1 MG/GM VA CREA: TOPICAL_CREAM | VAGINAL | 12 refills | Status: AC

## 2016-05-31 NOTE — Progress Notes (Signed)
In and Out Catheterization  Patient is present today for a I & O catheterization due to needing a clean catch urine. Patient was cleaned and prepped in a sterile fashion with betadine and Lidocaine 2% jelly was instilled into the urethra.  A 14FR cath was inserted no complications were noted , 50ml of urine return was noted, urine was red in color. A clean urine sample was collected for urinalysis and culture. Bladder was drained and catheter was removed with out difficulty.    Preformed by: Dallas Schimkeamona Williams CMA

## 2016-05-31 NOTE — Progress Notes (Signed)
05/31/2016 11:59 AM   [place] 30-Jul-1930 [id number]  Referring provider: Patrice Paradise, MD [address] [facility] [place] [address]  Chief Complaint  Patient presents with  . New Patient (Initial Visit)    hematuria and kidney stone referred by Lora Paula    HPI: Patient is a 83 -year-old Caucasian female who presents today as a referral from their PCP, Patrice Paradise,  for microscopic hematuria with her daughter, [name] and son, [name].    Patient is a poor historian and history is obtained from son and daughter.  Patient was found to have microscopic hematuria on 05/14/2016 with 6-30 RBC's/hpf.  Patient doesn't have a prior history of microscopic hematuria.    She does not have a prior history of recurrent urinary tract infections, nephrolithiasis, trauma to the genitourinary tract, or malignancies of the genitourinary tract.   She does not have a family medical history of nephrolithiasis, malignancies of the genitourinary tract or hematuria.   Today, she is having symptoms of frequent urination, urgency, incontinence and gross hematuria.  Her UA today demonstrates . > 30 RBC's.  She is experiencing low back pain and suprapubic pain, she denies abdominal pain and flank pain. She denies any recent fevers, chills, nausea or vomiting.   She underwent a non contrast CT on 05/13/2016 which noted there are nonobstructing calcified stones within both kidneys.  Stable 4 mm soft tissue nodule in the right lower lobe, no further follow-up required.  Grade 1 anterolisthesis of L5 on S1 with chronic pars defect at L5.  I have independently reviewed the films.    She is a smoker.  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    PMH: Past Medical History:  Diagnosis Date  . Anxiety   . Arthritis   . COPD (chronic obstructive pulmonary disease) (HCC)    Documented  . Degenerative joint disease    degerative disc disease  . Dementia   .  Depression   . Hyperlipidemia   . Hypertension   . Osteoporosis   . Spondylolisthesis    with foraminal stenosis  . Stroke [facility])    mini stroke-2 yrs ago  . Thyroid disease 1979   Thyroid lesion    Surgical History: Past Surgical History:  Procedure Laterality Date  . ABDOMINAL HYSTERECTOMY  1997  . APPENDECTOMY  1979  . CHOLECYSTECTOMY  2002  . EXCISION OF ADNEXAL MASS Left   . EYE SURGERY Bilateral    Cataract  . FOOT SURGERY Right   . THYROID SURGERY     thyroid lesion resection  . TRIGGER FINGER RELEASE Left 09/11/2014   Procedure: RELEASE TRIGGER FINGER/A-1 PULLEY;  Surgeon: Christena Flake, MD;  Location[facility];  Service: Orthopedics;  Laterality: Left;  RISK FOR FALLS    Home Medications:  Allergies as of 05/31/2016   No Known Allergies     Medication List       Accurate as of 05/31/16 11:59 AM. Always use your most recent med list.          albuterol (2.5 MG/3ML) 0.083% nebulizer solution Commonly known as:  PROVENTIL Take 3 mLs by nebulization every 6 (six) hours as needed for wheezing.   aspirin EC 81 MG tablet Take 81 mg by mouth daily.   CALTRATE 600+D PO Take 1 tablet by mouth 2 (two) times daily. sporatic   cephALEXin 250 MG capsule Commonly known as:  KEFLEX Take 1 capsule (250 mg total)  by mouth 3 (three) times daily.   clonazePAM 0.5 MG tablet Commonly known as:  KLONOPIN Take 0.5 mg by mouth as needed.   cyanocobalamin 1000 MCG/ML injection Commonly known as:  (VITAMIN B-12) Inject 1,000 mcg into the muscle every 30 (thirty) days.   sertraline 25 MG tablet Commonly known as:  ZOLOFT Take 25 mg by mouth daily.       Allergies: No Known Allergies  Family History: Family History  Problem Relation Age of Onset  . Heart disease Mother 2785    died of attack  . Arthritis Mother   . Hyperlipidemia Mother   . Heart disease Father 3264    died of heart attack  . Prostate cancer Neg Hx   . Kidney cancer Neg Hx   . Bladder  Cancer Neg Hx     Social History:  reports that she has quit smoking. Her smoking use included Cigarettes. She has a 60.00 pack-year smoking history. She has never used smokeless tobacco. She reports that she does not drink alcohol or use drugs.  ROS: UROLOGY Frequent Urination?: Yes Hard to postpone urination?: Yes Burning/pain with urination?: No Get up at night to urinate?: No Leakage of urine?: Yes Urine stream starts and stops?: No Trouble starting stream?: No Do you have to strain to urinate?: No Blood in urine?: Yes Urinary tract infection?: No Sexually transmitted disease?: No Injury to kidneys or bladder?: No Painful intercourse?: No Weak stream?: No Currently pregnant?: No Vaginal bleeding?: No Last menstrual period?: n  Gastrointestinal Nausea?: Yes Vomiting?: No Indigestion/heartburn?: No Diarrhea?: No Constipation?: No  Constitutional Fever: No Night sweats?: No Weight loss?: No Fatigue?: No  Skin Skin rash/lesions?: No Itching?: No  Eyes Blurred vision?: No Double vision?: No  Ears/Nose/Throat Sore throat?: No Sinus problems?: No  Hematologic/Lymphatic Swollen glands?: No Easy bruising?: Yes  Cardiovascular Leg swelling?: No Chest pain?: No  Respiratory Cough?: Yes Shortness of breath?: Yes  Endocrine Excessive thirst?: Yes  Musculoskeletal Back pain?: Yes Joint pain?: No  Neurological Headaches?: No Dizziness?: Yes  Psychologic Depression?: Yes Anxiety?: Yes  Physical Exam: BP 117/65   Pulse 79   Ht 4\' 11"  (1.499 m)   Wt 77 lb 6.4 oz (35.1 kg)   BMI 15.63 kg/m   Constitutional: Cachectic.  Somnolent. Not orientated. HEENT: [place] AT, moist mucus membranes. Trachea midline, no masses. Cardiovascular: No clubbing, cyanosis, or edema. Respiratory: Normal respiratory effort, no increased work of breathing. GI: Abdomen is soft, non tender, non distended, no abdominal masses. Liver and spleen not palpable.  No hernias  appreciated.  Stool sample for occult testing is not indicated.   GU: No CVA tenderness.  No bladder fullness or masses.  Atrophic external genitalia, normal pubic hair distribution, no lesions.  Normal urethral meatus, no lesions, no prolapse, no discharge. Urethral caruncle is noted.  No urethral masses, tenderness and/or tenderness. No bladder fullness, tenderness or masses. Pale vagina mucosa, poor estrogen effect, no discharge, no lesions, good pelvic support, no cystocele or rectocele noted.  Area of irritation is noted in the introitus.  Cervix, uterus and adnexa are surgically absent.  Anus and perineum are without rashes or lesions.    Skin: No rashes, bruises or suspicious lesions. Lymph: No cervical or inguinal adenopathy. Neurologic: Grossly intact, no focal deficits, moving all 4 extremities. Psychiatric: Normal mood and affect.  Laboratory Data: Lab Results  Component Value Date   WBC 7.9 05/14/2016   HGB 13.9 05/14/2016   HCT 41.4 05/14/2016   MCV  94.4 05/14/2016   PLT 269 05/14/2016    Lab Results  Component Value Date   CREATININE 0.66 05/14/2016       Component Value Date/Time   CHOL 182 10/10/2012 0727   HDL 43 10/10/2012 0727   VLDL 17 10/10/2012 0727   LDLCALC 122 (H) 10/10/2012 0727    Lab Results  Component Value Date   AST 24 10/09/2012   Lab Results  Component Value Date   ALT 21 10/09/2012     Urinalysis > 30 RBC's .  See EPIC.   Pertinent Imaging: CLINICAL DATA:  Microscopic hematuria  EXAM: CT ABDOMEN AND PELVIS WITHOUT CONTRAST  TECHNIQUE: Multidetector CT imaging of the abdomen and pelvis was performed following the standard protocol without IV contrast.  COMPARISON:  09/22/2005  FINDINGS: Lower chest: Moderate emphysematous disease is present. Fibrosis and scarring within the bilateral right greater than left lung bases. Stable 4 mm soft tissue nodule in the subpleural posterior right lower lobe, felt benign due to lack of  interval change. No acute consolidation or effusion. Normal heart size.  Hepatobiliary: No focal liver abnormality is seen. Status post cholecystectomy. No biliary dilatation.  Pancreas: Unremarkable. No pancreatic ductal dilatation or surrounding inflammatory changes.  Spleen: Normal in size without focal abnormality.  Adrenals/Urinary Tract: Mild nodular enlargement of left adrenal gland. Right adrenal gland within normal limits. Kidneys show no hydronephrosis. Bilateral intrarenal calculi. A stone in the lower pole on the right measures 3 mm. Largest stone in the lower pole of left kidney measures 4 mm. No ureteral stones. Bladder normal.  Stomach/Bowel: The stomach is nonenlarged. No dilated small bowel. No colon wall thickening.  Vascular/Lymphatic: Aortic atherosclerosis. No enlarged abdominal or pelvic lymph nodes.  Reproductive: Status post hysterectomy. No adnexal masses.  Other: No free air or free fluid.  Musculoskeletal: Degenerative changes of the spine. Grade 1 anterolisthesis of L5 on S1 with chronic appearing bilateral pars defect at L5.  IMPRESSION: 1. There are nonobstructing calcified stones within both kidneys. 2. Stable 4 mm soft tissue nodule in the right lower lobe, no further follow-up required. 3. Grade 1 anterolisthesis of L5 on S1 with chronic pars defect at L5.   Electronically Signed   By: Jasmine Pang M.D.   On: 05/11/2016 18:56   Assessment & Plan:    1. Gross hematuria  - I explained to the patient that there are a number of causes that can be associated with blood in the urine, such as stones,  UTI's, damage to the urinary tract and/or cancer.  - Due to patient's age and severe dementia - we will hold on any further urologic evaluation  - I explained to the family that any worrisome findings would be approached in a palliative manner and not curative as patient would not be good surgical candidate and would not tolerate  an in office cystoscopy  - I encouraged the family not to pursue any interventions until the patient becomes symptomatic  - UA  - Urine culture  - BUN + creatinine    2. Vaginal atrophy  - I explained to the patient's family that there is an area of irritation in the vagina that may be the source of the bleeding   - Patient was given a sample of vaginal estrogen cream (Estrace) and instructed to apply 0.5mg  (pea-sized amount)  just inside the vaginal introitus with a finger-tip every night for two weeks and then Monday, Wednesday and Friday nights.  I explained to the patient that  vaginally administered estrogen, which causes only a slight increase in the blood estrogen levels, have fewer contraindications and adverse systemic effects that oral HT.  -I have also given prescriptions for the Estrace cream, so that the patient may carry them to the pharmacy to see which one of the branded creams would be most economical for her.  If she finds both medications cost prohibitive, she is instructed to call the office.  We can then call in a compounded vaginal estrogen cream for the patient that may be more affordable.    - She will follow up in 2 weeks for an exam.    3. Bilateral renal stones  - no intervention warranted at this time  Advised to contact our office or seek treatment in the ED if becomes febrile or pain/ vomiting are difficult control in order to arrange for emergent/urgent intervention  Return in about 2 weeks (around 06/14/2016) for exam.  These notes generated with voice recognition software. I apologize for typographical errors.  Michiel Cowboy, PA-C  [facility] [address], [address] [place], [place] [address] [phone]

## 2016-06-01 LAB — BUN+CREAT
BUN/Creatinine Ratio: 23 (ref 12–28)
BUN: 14 mg/dL (ref 8–27)
CREATININE: 0.61 mg/dL (ref 0.57–1.00)
GFR calc Af Amer: 97 mL/min/{1.73_m2} (ref >59)
GFR calc non Af Amer: 84 mL/min/{1.73_m2} (ref >59)

## 2016-06-06 LAB — CULTURE, URINE COMPREHENSIVE

## 2016-06-07 DIAGNOSIS — N3001 Acute cystitis with hematuria: Secondary | ICD-10-CM | POA: Diagnosis not present

## 2016-06-07 DIAGNOSIS — J449 Chronic obstructive pulmonary disease, unspecified: Secondary | ICD-10-CM | POA: Diagnosis not present

## 2016-06-07 DIAGNOSIS — G308 Other Alzheimer's disease: Secondary | ICD-10-CM | POA: Diagnosis not present

## 2016-06-07 DIAGNOSIS — M6281 Muscle weakness (generalized): Secondary | ICD-10-CM | POA: Diagnosis not present

## 2016-06-07 DIAGNOSIS — M545 Low back pain: Secondary | ICD-10-CM | POA: Diagnosis not present

## 2016-06-07 DIAGNOSIS — N209 Urinary calculus, unspecified: Secondary | ICD-10-CM | POA: Diagnosis not present

## 2016-06-07 DIAGNOSIS — Z111 Encounter for screening for respiratory tuberculosis: Secondary | ICD-10-CM | POA: Diagnosis not present

## 2016-06-07 DIAGNOSIS — N390 Urinary tract infection, site not specified: Secondary | ICD-10-CM | POA: Diagnosis not present

## 2016-06-13 NOTE — Progress Notes (Signed)
06/14/2016 11:47 AM   Allison Sullivan [birth year] [id number]  Referring provider: Patrice Paradise, MD [address] [facility] [address] [facility] [place], [place] [address]  Chief Complaint  Patient presents with  . Hematuria    2 wk follow up    HPI: 83 yo WF with dementia presents today for a 2 week follow up for vaginal atrophy and gross hematuria.  Background history Patient is a 89 -year-old Caucasian female who presents today as a referral from their PCP, Patrice Paradise,  for microscopic hematuria with her daughter, Allison Sullivan and son, Allison Sullivan.  Patient is a poor historian and history is obtained from son and daughter.  Patient was found to have microscopic hematuria on 05/14/2016 with 6-30 RBC's/hpf.  Patient doesn't have a prior history of microscopic hematuria.  She does not have a prior history of recurrent urinary tract infections, nephrolithiasis, trauma to the genitourinary tract, or malignancies of the genitourinary tract.   She does not have a family medical history of nephrolithiasis, malignancies of the genitourinary tract or hematuria.  Today, she is having symptoms of frequent urination, urgency, incontinence and gross hematuria.  Her UA today demonstrates . > 30 RBC's.  She is experiencing low back pain and suprapubic pain, she denies abdominal pain and flank pain. She denies any recent fevers, chills, nausea or vomiting.  She underwent a non contrast CT on 05/13/2016 which noted there are nonobstructing calcified stones within both kidneys.  Stable 4 mm soft tissue nodule in the right lower lobe, no further follow-up required.  Grade 1 anterolisthesis of L5 on S1 with chronic pars defect at L5.  I have independently reviewed the films.  She is a smoker.  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   At her last visit, her vagina was found to be irritated and atrophic.  This may have been the source of the hematuria.  She was given vaginal estrogen cream to apply to the  vaginal area.  Her daughter and son feel that they have been applying it at her nursing facility.  UA today demonstrates 11-30 WBC's and > 30 RBC's.  The facility has not reported any gross hematuria.  She has not had fevers, chills, nausea or vomiting.    PMH: Past Medical History:  Diagnosis Date  . Anxiety   . Arthritis   . COPD (chronic obstructive pulmonary disease) (HCC)    Documented  . Degenerative joint disease    degerative disc disease  . Dementia   . Depression   . Hyperlipidemia   . Hypertension   . Osteoporosis   . Spondylolisthesis    with foraminal stenosis  . Stroke [facility])    mini stroke-2 yrs ago  . Thyroid disease 1979   Thyroid lesion    Surgical History: Past Surgical History:  Procedure Laterality Date  . ABDOMINAL HYSTERECTOMY  1997  . APPENDECTOMY  1979  . CHOLECYSTECTOMY  2002  . EXCISION OF ADNEXAL MASS Left   . EYE SURGERY Bilateral    Cataract  . FOOT SURGERY Right   . THYROID SURGERY     thyroid lesion resection  . TRIGGER FINGER RELEASE Left 09/11/2014   Procedure: RELEASE TRIGGER FINGER/A-1 PULLEY;  Surgeon: Christena Flake, MD;  Location: [facility];  Service: Orthopedics;  Laterality: Left;  RISK FOR FALLS    Home Medications:  Allergies as of 06/14/2016   No Known Allergies     Medication List       Accurate  as of 06/14/16 11:47 AM. Always use your most recent med list.          albuterol (2.5 MG/3ML) 0.083% nebulizer solution Commonly known as:  PROVENTIL Take 3 mLs by nebulization every 6 (six) hours as needed for wheezing.   aspirin EC 81 MG tablet Take 81 mg by mouth daily.   CALTRATE 600+D PO Take 1 tablet by mouth 2 (two) times daily. sporatic   clonazePAM 0.5 MG tablet Commonly known as:  KLONOPIN Take 0.5 mg by mouth as needed.   cyanocobalamin 1000 MCG/ML injection Commonly known as:  (VITAMIN B-12) Inject 1,000 mcg into the muscle every 30 (thirty) days.   estradiol 0.1 MG/GM vaginal cream Commonly  known as:  ESTRACE VAGINAL Apply 0.5mg  (pea-sized amount)  just inside the vaginal introitus with a finger-tip on  Monday, Wednesday and Friday nights.   sertraline 25 MG tablet Commonly known as:  ZOLOFT Take 25 mg by mouth daily.       Allergies: No Known Allergies  Family History: Family History  Problem Relation Age of Onset  . Heart disease Mother 23    died of attack  . Arthritis Mother   . Hyperlipidemia Mother   . Heart disease Father 42    died of heart attack  . Prostate cancer Neg Hx   . Kidney cancer Neg Hx   . Bladder Cancer Neg Hx     Social History:  reports that she has quit smoking. Her smoking use included Cigarettes. She has a 60.00 pack-year smoking history. She has never used smokeless tobacco. She reports that she does not drink alcohol or use drugs.  ROS: UROLOGY Frequent Urination?: Yes Hard to postpone urination?: Yes Burning/pain with urination?: No Get up at night to urinate?: No Leakage of urine?: Yes Urine stream starts and stops?: No Trouble starting stream?: No Do you have to strain to urinate?: No Blood in urine?: No Urinary tract infection?: No Sexually transmitted disease?: No Injury to kidneys or bladder?: No Painful intercourse?: No Weak stream?: No Currently pregnant?: No Vaginal bleeding?: No Last menstrual period?: n  Gastrointestinal Nausea?: No Vomiting?: No Indigestion/heartburn?: No Diarrhea?: No Constipation?: No  Constitutional Fever: No Night sweats?: No Weight loss?: No Fatigue?: No  Skin Skin rash/lesions?: No Itching?: No  Eyes Blurred vision?: No Double vision?: No  Ears/Nose/Throat Sore throat?: No Sinus problems?: No  Hematologic/Lymphatic Swollen glands?: No Easy bruising?: Yes  Cardiovascular Leg swelling?: No Chest pain?: No  Respiratory Cough?: Yes Shortness of breath?: Yes  Endocrine Excessive thirst?: Yes  Musculoskeletal Back pain?: Yes Joint pain?:  No  Neurological Headaches?: No Dizziness?: Yes  Psychologic Depression?: Yes Anxiety?: Yes  Physical Exam: BP (!) 151/67   Pulse 64   Ht 4\' 11"  (1.499 m)   Wt 78 lb (35.4 kg)   BMI 15.75 kg/m   Constitutional: Cachectic.  Somnolent. Not orientated. HEENT: [place] AT, moist mucus membranes. Trachea midline, no masses. Cardiovascular: No clubbing, cyanosis, or edema. Respiratory: Normal respiratory effort, no increased work of breathing. GI: Abdomen is soft, non tender, non distended, no abdominal masses. Liver and spleen not palpable.  No hernias appreciated.  Stool sample for occult testing is not indicated.   GU: No CVA tenderness.  No bladder fullness or masses.  Atrophic external genitalia, normal pubic hair distribution, no lesions.  Normal urethral meatus, no lesions, no prolapse, no discharge. Urethral caruncle is noted.  No urethral masses, tenderness and/or tenderness. No bladder fullness, tenderness or masses. Pale vagina mucosa,  poor estrogen effect, no discharge, no lesions, good pelvic support, no cystocele or rectocele noted.  The area of irritation in the introitus has improved.  Cervix, uterus and adnexa are surgically absent.  Anus and perineum are without rashes or lesions.    Skin: No rashes, bruises or suspicious lesions. Lymph: No cervical or inguinal adenopathy. Neurologic: Grossly intact, no focal deficits, moving all 4 extremities. Psychiatric: Normal mood and affect.  Laboratory Data: Lab Results  Component Value Date   WBC 7.9 05/14/2016   HGB 13.9 05/14/2016   HCT 41.4 05/14/2016   MCV 94.4 05/14/2016   PLT 269 05/14/2016    Lab Results  Component Value Date   CREATININE 0.61 05/31/2016       Component Value Date/Time   CHOL 182 10/10/2012 0727   HDL 43 10/10/2012 0727   VLDL 17 10/10/2012 0727   LDLCALC 122 (H) 10/10/2012 0727    Lab Results  Component Value Date   AST 24 10/09/2012   Lab Results  Component Value Date   ALT 21  10/09/2012   Urinalysis See EPIC.   Pertinent Imaging: CLINICAL DATA:  Microscopic hematuria  EXAM: CT ABDOMEN AND PELVIS WITHOUT CONTRAST  TECHNIQUE: Multidetector CT imaging of the abdomen and pelvis was performed following the standard protocol without IV contrast.  COMPARISON:  09/22/2005  FINDINGS: Lower chest: Moderate emphysematous disease is present. Fibrosis and scarring within the bilateral right greater than left lung bases. Stable 4 mm soft tissue nodule in the subpleural posterior right lower lobe, felt benign due to lack of interval change. No acute consolidation or effusion. Normal heart size.  Hepatobiliary: No focal liver abnormality is seen. Status post cholecystectomy. No biliary dilatation.  Pancreas: Unremarkable. No pancreatic ductal dilatation or surrounding inflammatory changes.  Spleen: Normal in size without focal abnormality.  Adrenals/Urinary Tract: Mild nodular enlargement of left adrenal gland. Right adrenal gland within normal limits. Kidneys show no hydronephrosis. Bilateral intrarenal calculi. A stone in the lower pole on the right measures 3 mm. Largest stone in the lower pole of left kidney measures 4 mm. No ureteral stones. Bladder normal.  Stomach/Bowel: The stomach is nonenlarged. No dilated small bowel. No colon wall thickening.  Vascular/Lymphatic: Aortic atherosclerosis. No enlarged abdominal or pelvic lymph nodes.  Reproductive: Status post hysterectomy. No adnexal masses.  Other: No free air or free fluid.  Musculoskeletal: Degenerative changes of the spine. Grade 1 anterolisthesis of L5 on S1 with chronic appearing bilateral pars defect at L5.  IMPRESSION: 1. There are nonobstructing calcified stones within both kidneys. 2. Stable 4 mm soft tissue nodule in the right lower lobe, no further follow-up required. 3. Grade 1 anterolisthesis of L5 on S1 with chronic pars defect at L5.   Electronically  Signed   By: Jasmine Pang M.D.   On: 05/11/2016 18:56   Assessment & Plan:    1. Gross hematuria  - no recent gross hematuria reported by facility  - UA is positive for 11-30 WBC's and > 30 RBC's - will send for culture- hold antibiotic until cultures are available  - Due to patient's age and severe dementia - we will hold on any further urologic evaluation  - I explained to the family that any worrisome findings would be approached in a palliative manner and not curative as patient would not be good surgical candidate and would not tolerate an in office cystoscopy  - I encouraged the family not to pursue any interventions until the patient becomes symptomatic  -  UA  - Urine culture   2. Vaginal atrophy  - area of irritation has improved  - continue applying the vaginal cream three times weekly  - RTC in 3 months for exam  3. Bilateral renal stones  - no intervention warranted at this time  Advised to contact our office or seek treatment in the ED if becomes febrile or pain/ vomiting are difficult control in order to arrange for emergent/urgent intervention  Return for pending urine culture.  These notes generated with voice recognition software. I apologize for typographical errors.  Michiel CowboySHANNON Chester Sibert, PA-C  [facility] [address], [address] [place], [place] [address] [phone]

## 2016-06-14 ENCOUNTER — Encounter: Payer: Self-pay | Admitting: Urology

## 2016-06-14 ENCOUNTER — Ambulatory Visit (INDEPENDENT_AMBULATORY_CARE_PROVIDER_SITE_OTHER): Payer: Medicare HMO | Admitting: Urology

## 2016-06-14 VITALS — BP 151/67 | HR 64 | Ht 59.0 in | Wt 78.0 lb

## 2016-06-14 DIAGNOSIS — R310 Gross hematuria: Secondary | ICD-10-CM

## 2016-06-14 DIAGNOSIS — N200 Calculus of kidney: Secondary | ICD-10-CM

## 2016-06-14 DIAGNOSIS — N952 Postmenopausal atrophic vaginitis: Secondary | ICD-10-CM

## 2016-06-14 DIAGNOSIS — J449 Chronic obstructive pulmonary disease, unspecified: Secondary | ICD-10-CM | POA: Insufficient documentation

## 2016-06-14 DIAGNOSIS — M810 Age-related osteoporosis without current pathological fracture: Secondary | ICD-10-CM | POA: Insufficient documentation

## 2016-06-14 DIAGNOSIS — Z720 Tobacco use: Secondary | ICD-10-CM | POA: Insufficient documentation

## 2016-06-14 DIAGNOSIS — I10 Essential (primary) hypertension: Secondary | ICD-10-CM | POA: Insufficient documentation

## 2016-06-14 DIAGNOSIS — F0390 Unspecified dementia without behavioral disturbance: Secondary | ICD-10-CM | POA: Insufficient documentation

## 2016-06-14 DIAGNOSIS — M1990 Unspecified osteoarthritis, unspecified site: Secondary | ICD-10-CM | POA: Insufficient documentation

## 2016-06-14 LAB — URINALYSIS, COMPLETE
BILIRUBIN UA: NEGATIVE
GLUCOSE, UA: NEGATIVE
Ketones, UA: NEGATIVE
NITRITE UA: NEGATIVE
SPEC GRAV UA: 1.02 (ref 1.005–1.030)
Urobilinogen, Ur: 1 mg/dL (ref 0.2–1.0)
pH, UA: 8.5 — ABNORMAL HIGH (ref 5.0–7.5)

## 2016-06-14 LAB — MICROSCOPIC EXAMINATION
BACTERIA UA: NONE SEEN
Epithelial Cells (non renal): NONE SEEN /hpf (ref 0–10)

## 2016-06-14 NOTE — Patient Instructions (Signed)
Please apply the vaginal estrogen cream to just inside the vaginal introitus on Monday, Wednesday and Friday evenings.

## 2016-06-14 NOTE — Progress Notes (Signed)
In and Out Catheterization  Patient is present today for a I & O catheterization due to hematuria recheck( clean catch specimen) . Patient was cleaned and prepped in a sterile fashion with betadine and Lidocaine 2% jelly was instilled into the urethra.  A 14FR cath was inserted no complications were noted , 180ml of urine return was noted, urine was dark yellow in color. A clean urine sample was collected for UA. Bladder was drained  And catheter was removed with out difficulty.    Preformed by: Eligha BridegroomSarah Corday Wyka, CMA

## 2016-06-15 DIAGNOSIS — M6281 Muscle weakness (generalized): Secondary | ICD-10-CM | POA: Diagnosis not present

## 2016-06-15 DIAGNOSIS — J449 Chronic obstructive pulmonary disease, unspecified: Secondary | ICD-10-CM | POA: Diagnosis not present

## 2016-06-15 DIAGNOSIS — Z87440 Personal history of urinary (tract) infections: Secondary | ICD-10-CM | POA: Diagnosis not present

## 2016-06-15 DIAGNOSIS — F17200 Nicotine dependence, unspecified, uncomplicated: Secondary | ICD-10-CM | POA: Diagnosis not present

## 2016-06-15 DIAGNOSIS — J440 Chronic obstructive pulmonary disease with acute lower respiratory infection: Secondary | ICD-10-CM | POA: Diagnosis not present

## 2016-06-15 DIAGNOSIS — F0390 Unspecified dementia without behavioral disturbance: Secondary | ICD-10-CM | POA: Diagnosis not present

## 2016-06-15 DIAGNOSIS — Z9181 History of falling: Secondary | ICD-10-CM | POA: Diagnosis not present

## 2016-06-18 ENCOUNTER — Telehealth: Payer: Self-pay

## 2016-06-18 DIAGNOSIS — Z9181 History of falling: Secondary | ICD-10-CM | POA: Diagnosis not present

## 2016-06-18 DIAGNOSIS — N390 Urinary tract infection, site not specified: Secondary | ICD-10-CM

## 2016-06-18 DIAGNOSIS — F17200 Nicotine dependence, unspecified, uncomplicated: Secondary | ICD-10-CM | POA: Diagnosis not present

## 2016-06-18 DIAGNOSIS — F0390 Unspecified dementia without behavioral disturbance: Secondary | ICD-10-CM | POA: Diagnosis not present

## 2016-06-18 DIAGNOSIS — J449 Chronic obstructive pulmonary disease, unspecified: Secondary | ICD-10-CM | POA: Diagnosis not present

## 2016-06-18 DIAGNOSIS — M6281 Muscle weakness (generalized): Secondary | ICD-10-CM | POA: Diagnosis not present

## 2016-06-18 DIAGNOSIS — Z87440 Personal history of urinary (tract) infections: Secondary | ICD-10-CM | POA: Diagnosis not present

## 2016-06-18 LAB — CULTURE, URINE COMPREHENSIVE

## 2016-06-18 MED ORDER — ERYTHROMYCIN BASE 250 MG PO TABS: 250.0000 mg | ORAL_TABLET | Freq: Four times a day (QID) | ORAL | 0 refills | Status: DC

## 2016-06-18 NOTE — Telephone Encounter (Signed)
-----   Message from Harle BattiestShannon A McGowan, PA-C sent at 06/18/2016  3:11 PM EDT ----- Patient has a +UCx.  They need to start Erythromycin 250 mg, one tablet four time daily for seven days.  They also need to take a probiotic with the antibiotic course.  Then we need to check an UA after she completes antibiotics to ensure the blood clears from the urine.

## 2016-06-18 NOTE — Telephone Encounter (Signed)
Spoke with pt daughter in reference to +ucx, probiotic and needing a u/a post therapy. Daughter voiced understanding. Medication sent to pt pharmacy. Daughter will call to make lab appt when pt finishes abx.

## 2016-06-21 ENCOUNTER — Telehealth: Payer: Self-pay

## 2016-06-21 DIAGNOSIS — J449 Chronic obstructive pulmonary disease, unspecified: Secondary | ICD-10-CM | POA: Diagnosis not present

## 2016-06-21 DIAGNOSIS — M6281 Muscle weakness (generalized): Secondary | ICD-10-CM | POA: Diagnosis not present

## 2016-06-21 DIAGNOSIS — F0390 Unspecified dementia without behavioral disturbance: Secondary | ICD-10-CM | POA: Diagnosis not present

## 2016-06-21 DIAGNOSIS — Z9181 History of falling: Secondary | ICD-10-CM | POA: Diagnosis not present

## 2016-06-21 DIAGNOSIS — F17200 Nicotine dependence, unspecified, uncomplicated: Secondary | ICD-10-CM | POA: Diagnosis not present

## 2016-06-21 DIAGNOSIS — Z87440 Personal history of urinary (tract) infections: Secondary | ICD-10-CM | POA: Diagnosis not present

## 2016-06-21 NOTE — Telephone Encounter (Signed)
Spoke with Brandy at ID in reference to organism. Allison BienenstockBrandy stated she is going to consult with Dr. Sampson GoonFitzgerald and call back.

## 2016-06-21 NOTE — Telephone Encounter (Signed)
I am not familiar with this organism.  Erythromycin was what on found doing research that may address the organism.  Would you please consult ID regarding this organism and see if they need to see her?

## 2016-06-21 NOTE — Telephone Encounter (Signed)
Pharmacist from [facility] called stating they are not able to get erythrmycin. Please advise.

## 2016-06-22 ENCOUNTER — Telehealth: Payer: Self-pay

## 2016-06-22 NOTE — Telephone Encounter (Signed)
Pt daughter called wanting more information about pt and abx. Reinforced with daughter that ID has been consulted and ID requested sensitivities. Also made daughter aware sensit[facility] and when they are received will consult ID again and go from there. Reinforced with daughter if pt develops n/v, f/c, increased pain to seek tx at ER. Daughter voiced understanding of whole conversation.

## 2016-06-22 NOTE — Telephone Encounter (Signed)
Spoke with Gearldine BienenstockBrandy with Dr. Sampson GoonFitzgerald who stated that until there are sensitivities Dr. Letitia CaulFitzgerald wont be able to help.  [facility]oke with [facility] at [facility] in reference to sensitivities of organism. Dawn stated they would add them and fax them over.

## 2016-06-23 DIAGNOSIS — Z87440 Personal history of urinary (tract) infections: Secondary | ICD-10-CM | POA: Diagnosis not present

## 2016-06-23 DIAGNOSIS — F17200 Nicotine dependence, unspecified, uncomplicated: Secondary | ICD-10-CM | POA: Diagnosis not present

## 2016-06-23 DIAGNOSIS — M6281 Muscle weakness (generalized): Secondary | ICD-10-CM | POA: Diagnosis not present

## 2016-06-23 DIAGNOSIS — Z9181 History of falling: Secondary | ICD-10-CM | POA: Diagnosis not present

## 2016-06-23 DIAGNOSIS — F0390 Unspecified dementia without behavioral disturbance: Secondary | ICD-10-CM | POA: Diagnosis not present

## 2016-06-23 DIAGNOSIS — J449 Chronic obstructive pulmonary disease, unspecified: Secondary | ICD-10-CM | POA: Diagnosis not present

## 2016-06-24 ENCOUNTER — Telehealth: Payer: Self-pay

## 2016-06-24 DIAGNOSIS — F0390 Unspecified dementia without behavioral disturbance: Secondary | ICD-10-CM | POA: Diagnosis not present

## 2016-06-24 DIAGNOSIS — Z9181 History of falling: Secondary | ICD-10-CM | POA: Diagnosis not present

## 2016-06-24 DIAGNOSIS — J449 Chronic obstructive pulmonary disease, unspecified: Secondary | ICD-10-CM | POA: Diagnosis not present

## 2016-06-24 DIAGNOSIS — Z87440 Personal history of urinary (tract) infections: Secondary | ICD-10-CM | POA: Diagnosis not present

## 2016-06-24 DIAGNOSIS — M6281 Muscle weakness (generalized): Secondary | ICD-10-CM | POA: Diagnosis not present

## 2016-06-24 DIAGNOSIS — F17200 Nicotine dependence, unspecified, uncomplicated: Secondary | ICD-10-CM | POA: Diagnosis not present

## 2016-06-24 NOTE — Telephone Encounter (Signed)
Pt daughter called wanting to know if an abx has been decided yet for +ucx. Please advise.

## 2016-06-24 NOTE — Telephone Encounter (Signed)
Sensitivities are still pending.  We will let them know as soon as we see the results.

## 2016-06-25 NOTE — Telephone Encounter (Signed)
Spoke with pt daughter in reference to still waiting for sensitivities. Daughter voiced understanding.

## 2016-06-27 LAB — SUSCEPTIBILITY, GRAM POS RODS

## 2016-06-27 LAB — SPECIMEN STATUS REPORT

## 2016-06-28 ENCOUNTER — Telehealth: Payer: Self-pay

## 2016-06-28 DIAGNOSIS — N390 Urinary tract infection, site not specified: Secondary | ICD-10-CM

## 2016-06-28 MED ORDER — DOXYCYCLINE HYCLATE 100 MG PO CAPS: 100.0000 mg | ORAL_CAPSULE | Freq: Two times a day (BID) | ORAL | 0 refills | Status: DC

## 2016-06-28 NOTE — Telephone Encounter (Signed)
Left message for Brandy at Dr. Jarrett AblesFitzgerald's office.

## 2016-06-28 NOTE — Telephone Encounter (Signed)
Spoke with pt daughter in reference to doxycycline. Daughter voiced understanding. Faxed orders to [facility] and medication sent to pharmacy.

## 2016-06-28 NOTE — Telephone Encounter (Signed)
-----   Message from Harle BattiestShannon A McGowan, PA-C sent at 06/27/2016  8:39 PM EDT ----- Please notify Dr. Jarrett AblesFitzgerald's office that the sensitivities are available.

## 2016-06-28 NOTE — Telephone Encounter (Signed)
For seven days.

## 2016-06-28 NOTE — Telephone Encounter (Signed)
Brandy returned call. Per Dr. Sampson GoonFitzgerald doxycycline 100mg  bid and for you to determine duration. Please advise.

## 2016-06-29 DIAGNOSIS — Z87440 Personal history of urinary (tract) infections: Secondary | ICD-10-CM | POA: Diagnosis not present

## 2016-06-29 DIAGNOSIS — F17200 Nicotine dependence, unspecified, uncomplicated: Secondary | ICD-10-CM | POA: Diagnosis not present

## 2016-06-29 DIAGNOSIS — Z9181 History of falling: Secondary | ICD-10-CM | POA: Diagnosis not present

## 2016-06-29 DIAGNOSIS — M6281 Muscle weakness (generalized): Secondary | ICD-10-CM | POA: Diagnosis not present

## 2016-06-29 DIAGNOSIS — J449 Chronic obstructive pulmonary disease, unspecified: Secondary | ICD-10-CM | POA: Diagnosis not present

## 2016-06-29 DIAGNOSIS — F0390 Unspecified dementia without behavioral disturbance: Secondary | ICD-10-CM | POA: Diagnosis not present

## 2016-07-01 DIAGNOSIS — Z87440 Personal history of urinary (tract) infections: Secondary | ICD-10-CM | POA: Diagnosis not present

## 2016-07-01 DIAGNOSIS — M6281 Muscle weakness (generalized): Secondary | ICD-10-CM | POA: Diagnosis not present

## 2016-07-01 DIAGNOSIS — F0390 Unspecified dementia without behavioral disturbance: Secondary | ICD-10-CM | POA: Diagnosis not present

## 2016-07-01 DIAGNOSIS — F17200 Nicotine dependence, unspecified, uncomplicated: Secondary | ICD-10-CM | POA: Diagnosis not present

## 2016-07-01 DIAGNOSIS — J449 Chronic obstructive pulmonary disease, unspecified: Secondary | ICD-10-CM | POA: Diagnosis not present

## 2016-07-01 DIAGNOSIS — Z9181 History of falling: Secondary | ICD-10-CM | POA: Diagnosis not present

## 2016-07-02 DIAGNOSIS — Z9181 History of falling: Secondary | ICD-10-CM | POA: Diagnosis not present

## 2016-07-02 DIAGNOSIS — F0390 Unspecified dementia without behavioral disturbance: Secondary | ICD-10-CM | POA: Diagnosis not present

## 2016-07-02 DIAGNOSIS — F17200 Nicotine dependence, unspecified, uncomplicated: Secondary | ICD-10-CM | POA: Diagnosis not present

## 2016-07-02 DIAGNOSIS — M6281 Muscle weakness (generalized): Secondary | ICD-10-CM | POA: Diagnosis not present

## 2016-07-02 DIAGNOSIS — Z87440 Personal history of urinary (tract) infections: Secondary | ICD-10-CM | POA: Diagnosis not present

## 2016-07-02 DIAGNOSIS — J449 Chronic obstructive pulmonary disease, unspecified: Secondary | ICD-10-CM | POA: Diagnosis not present

## 2016-07-06 DIAGNOSIS — Z87440 Personal history of urinary (tract) infections: Secondary | ICD-10-CM | POA: Diagnosis not present

## 2016-07-06 DIAGNOSIS — Z9181 History of falling: Secondary | ICD-10-CM | POA: Diagnosis not present

## 2016-07-06 DIAGNOSIS — F0390 Unspecified dementia without behavioral disturbance: Secondary | ICD-10-CM | POA: Diagnosis not present

## 2016-07-06 DIAGNOSIS — F17200 Nicotine dependence, unspecified, uncomplicated: Secondary | ICD-10-CM | POA: Diagnosis not present

## 2016-07-06 DIAGNOSIS — M6281 Muscle weakness (generalized): Secondary | ICD-10-CM | POA: Diagnosis not present

## 2016-07-06 DIAGNOSIS — J449 Chronic obstructive pulmonary disease, unspecified: Secondary | ICD-10-CM | POA: Diagnosis not present

## 2016-07-08 DIAGNOSIS — R609 Edema, unspecified: Secondary | ICD-10-CM | POA: Diagnosis not present

## 2016-07-08 DIAGNOSIS — Z79899 Other long term (current) drug therapy: Secondary | ICD-10-CM | POA: Diagnosis not present

## 2016-07-08 DIAGNOSIS — S93699A Other sprain of unspecified foot, initial encounter: Secondary | ICD-10-CM | POA: Diagnosis not present

## 2016-07-08 DIAGNOSIS — Z993 Dependence on wheelchair: Secondary | ICD-10-CM | POA: Diagnosis not present

## 2016-07-08 DIAGNOSIS — M6281 Muscle weakness (generalized): Secondary | ICD-10-CM | POA: Diagnosis not present

## 2016-07-08 DIAGNOSIS — R2242 Localized swelling, mass and lump, left lower limb: Secondary | ICD-10-CM | POA: Diagnosis not present

## 2016-07-08 DIAGNOSIS — G308 Other Alzheimer's disease: Secondary | ICD-10-CM | POA: Diagnosis not present

## 2016-07-09 DIAGNOSIS — F17200 Nicotine dependence, unspecified, uncomplicated: Secondary | ICD-10-CM | POA: Diagnosis not present

## 2016-07-09 DIAGNOSIS — Z87440 Personal history of urinary (tract) infections: Secondary | ICD-10-CM | POA: Diagnosis not present

## 2016-07-09 DIAGNOSIS — M6281 Muscle weakness (generalized): Secondary | ICD-10-CM | POA: Diagnosis not present

## 2016-07-09 DIAGNOSIS — Z9181 History of falling: Secondary | ICD-10-CM | POA: Diagnosis not present

## 2016-07-09 DIAGNOSIS — J449 Chronic obstructive pulmonary disease, unspecified: Secondary | ICD-10-CM | POA: Diagnosis not present

## 2016-07-09 DIAGNOSIS — F0390 Unspecified dementia without behavioral disturbance: Secondary | ICD-10-CM | POA: Diagnosis not present

## 2016-07-12 DIAGNOSIS — F0390 Unspecified dementia without behavioral disturbance: Secondary | ICD-10-CM | POA: Diagnosis not present

## 2016-07-12 DIAGNOSIS — M6281 Muscle weakness (generalized): Secondary | ICD-10-CM | POA: Diagnosis not present

## 2016-07-12 DIAGNOSIS — Z87440 Personal history of urinary (tract) infections: Secondary | ICD-10-CM | POA: Diagnosis not present

## 2016-07-12 DIAGNOSIS — J449 Chronic obstructive pulmonary disease, unspecified: Secondary | ICD-10-CM | POA: Diagnosis not present

## 2016-07-12 DIAGNOSIS — F17200 Nicotine dependence, unspecified, uncomplicated: Secondary | ICD-10-CM | POA: Diagnosis not present

## 2016-07-12 DIAGNOSIS — R52 Pain, unspecified: Secondary | ICD-10-CM | POA: Diagnosis not present

## 2016-07-12 DIAGNOSIS — Z5189 Encounter for other specified aftercare: Secondary | ICD-10-CM | POA: Diagnosis not present

## 2016-07-12 DIAGNOSIS — M25551 Pain in right hip: Secondary | ICD-10-CM | POA: Diagnosis not present

## 2016-07-12 DIAGNOSIS — Z9181 History of falling: Secondary | ICD-10-CM | POA: Diagnosis not present

## 2016-07-12 NOTE — Progress Notes (Signed)
07/13/2016 4:58 PM   Allison Sullivan [birth year] [id number]  Referring provider: Patrice Paradise, MD [address] [facility] [place] [place] [address]  Chief Complaint  Patient presents with  . Follow-up    after antibiotics     HPI: 83 yo WF with dementia presents today for follow up after taking doxycycline for a Corynebacteria UTI.    Background history Patient is a 72 -year-old Caucasian female who presents today as a referral from their PCP, Allison Sullivan,  for microscopic hematuria with her daughter, Allison Sullivan and son, Allison Sullivan.  Patient is a poor historian and history is obtained from son and daughter.  Patient was found to have microscopic hematuria on 05/14/2016 with 6-30 RBC's/hpf.  Patient doesn't have a prior history of microscopic hematuria.  She does not have a prior history of recurrent urinary tract infections, nephrolithiasis, trauma to the genitourinary tract, or malignancies of the genitourinary tract.   She does not have a family medical history of nephrolithiasis, malignancies of the genitourinary tract or hematuria.  Today, she is having symptoms of frequent urination, urgency, incontinence and gross hematuria.  Her UA today demonstrates . > 30 RBC's.  She is experiencing low back pain and suprapubic pain, she denies abdominal pain and flank pain. She denies any recent fevers, chills, nausea or vomiting.  She underwent a non contrast CT on 05/13/2016 which noted there are nonobstructing calcified stones within both kidneys.  Stable 4 mm soft tissue nodule in the right lower lobe, no further follow-up required.  Grade 1 anterolisthesis of L5 on S1 with chronic pars defect at L5.  I have independently reviewed the films.  She is a smoker.  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   At her last visit, her vagina was found to be irritated and atrophic.  This may have been the source of the hematuria.  She was given vaginal estrogen cream to apply to the  vaginal area.  Her daughter and son feel that they have been applying it at her nursing facility.  at her last visit demonstrated 11-30 WBC's and > 30 RBC's.  Urine culture was positive for Corynebacteria and patient was placed on doxycycline.  She has completed her antibiotic and her CATH UA today is unremarkable.   The facility has not reported any gross hematuria.  She has not had fevers, chills, nausea or vomiting.    PMH: Past Medical History:  Diagnosis Date  . Anxiety   . Arthritis   . COPD (chronic obstructive pulmonary disease) (HCC)    Documented  . Degenerative joint disease    degerative disc disease  . Dementia   . Depression   . Hyperlipidemia   . Hypertension   . Osteoporosis   . Spondylolisthesis    with foraminal stenosis  . Stroke [facility])    mini stroke-2 yrs ago  . Thyroid disease 1979   Thyroid lesion    Surgical History: Past Surgical History:  Procedure Laterality Date  . ABDOMINAL HYSTERECTOMY  1997  . APPENDECTOMY  1979  . CHOLECYSTECTOMY  2002  . EXCISION OF ADNEXAL MASS Left   . EYE SURGERY Bilateral    Cataract  . FOOT SURGERY Right   . THYROID SURGERY     thyroid lesion resection  . TRIGGER FINGER RELEASE Left 09/11/2014   Procedure: RELEASE TRIGGER FINGER/A-1 PULLEY;  Surgeon: Allison Flake, MD;  Location: [facility];  Service: Orthopedics;  Laterality: Left;  RISK FOR FALLS  Home Medications:  Allergies as of 07/13/2016   No Known Allergies     Medication List       Accurate as of 07/13/16  4:58 PM. Always use your most recent med list.          albuterol (2.5 MG/3ML) 0.083% nebulizer solution Commonly known as:  PROVENTIL Take 3 mLs by nebulization every 6 (six) hours as needed for wheezing.   aspirin EC 81 MG tablet Take 81 mg by mouth daily.   CALTRATE 600+D PO Take 1 tablet by mouth 2 (two) times daily. sporatic   clonazePAM 0.5 MG tablet Commonly known as:  KLONOPIN Take 0.5 mg by mouth as needed.     cyanocobalamin 1000 MCG/ML injection Commonly known as:  (VITAMIN B-12) Inject 1,000 mcg into the muscle every 30 (thirty) days.   doxycycline 100 MG capsule Commonly known as:  VIBRAMYCIN Take 1 capsule (100 mg total) by mouth every 12 (twelve) hours.   erythromycin 250 MG tablet Commonly known as:  E-MYCIN Take 1 tablet (250 mg total) by mouth 4 (four) times daily.   estradiol 0.1 MG/GM vaginal cream Commonly known as:  ESTRACE VAGINAL Apply 0.5mg  (pea-sized amount)  just inside the vaginal introitus with a finger-tip on  Monday, Wednesday and Friday nights.   sertraline 25 MG tablet Commonly known as:  ZOLOFT Take 25 mg by mouth daily.       Allergies: No Known Allergies  Family History: Family History  Problem Relation Age of Onset  . Heart disease Mother 74    died of attack  . Arthritis Mother   . Hyperlipidemia Mother   . Heart disease Father 3    died of heart attack  . Prostate cancer Neg Hx   . Kidney cancer Neg Hx   . Bladder Cancer Neg Hx     Social History:  reports that she has quit smoking. Her smoking use included Cigarettes. She has a 60.00 pack-year smoking history. She has never used smokeless tobacco. She reports that she does not drink alcohol or use drugs.  ROS: UROLOGY Frequent Urination?: No Hard to postpone urination?: No Burning/pain with urination?: No Get up at night to urinate?: No Leakage of urine?: No Urine stream starts and stops?: No Trouble starting stream?: No Do you have to strain to urinate?: No Blood in urine?: No Urinary tract infection?: No Sexually transmitted disease?: No Injury to kidneys or bladder?: No Painful intercourse?: No Weak stream?: No Currently pregnant?: No Vaginal bleeding?: No Last menstrual period?: n  Gastrointestinal Nausea?: No Vomiting?: No Indigestion/heartburn?: No Diarrhea?: No Constipation?: No  Constitutional Fever: No Night sweats?: No Weight loss?: No Fatigue?:  No  Skin Skin rash/lesions?: No Itching?: No  Eyes Blurred vision?: Yes Double vision?: Yes  Ears/Nose/Throat Sore throat?: No Sinus problems?: No  Hematologic/Lymphatic Swollen glands?: No Easy bruising?: No  Cardiovascular Leg swelling?: No Chest pain?: No  Respiratory Cough?: No Shortness of breath?: Yes  Endocrine Excessive thirst?: No  Musculoskeletal Back pain?: Yes Joint pain?: No  Neurological Headaches?: No Dizziness?: No  Psychologic Depression?: No Anxiety?: Yes  Physical Exam: BP (!) 163/75   Pulse 72   Ht 5' (1.524 m)   Wt 93 lb 12.8 oz (42.5 kg)   BMI 18.32 kg/m   Constitutional: Cachectic.  Somnolent. Not orientated. HEENT: [place] AT, moist mucus membranes. Trachea midline, no masses. Cardiovascular: No clubbing, cyanosis, or edema. Respiratory: Normal respiratory effort, no increased work of breathing. GI: Abdomen is soft, non tender, non  distended, no abdominal masses. Liver and spleen not palpable.  No hernias appreciated.  Stool sample for occult testing is not indicated.   GU: No CVA tenderness.  No bladder fullness or masses.  Atrophic external genitalia, normal pubic hair distribution, no lesions.  Normal urethral meatus, no lesions, no prolapse, no discharge. Urethral caruncle is noted.  No urethral masses, tenderness and/or tenderness. No bladder fullness, tenderness or masses. Pale vagina mucosa, poor estrogen effect, no discharge, no lesions, good pelvic support, no cystocele or rectocele noted.  The area of irritation in the introitus reduced in size to 4 mm x 4 mm.  Cervix, uterus and adnexa are surgically absent.  Anus and perineum are without rashes or lesions.    Skin: No rashes, bruises or suspicious lesions. Lymph: No cervical or inguinal adenopathy. Neurologic: Grossly intact, no focal deficits, moving all 4 extremities. Psychiatric: Normal mood and affect.  Laboratory Data: Lab Results  Component Value Date   WBC 7.9  05/14/2016   HGB 13.9 05/14/2016   HCT 41.4 05/14/2016   MCV 94.4 05/14/2016   PLT 269 05/14/2016    Lab Results  Component Value Date   CREATININE 0.61 05/31/2016       Component Value Date/Time   CHOL 182 10/10/2012 0727   HDL 43 10/10/2012 0727   VLDL 17 10/10/2012 0727   LDLCALC 122 (H) 10/10/2012 0727    Lab Results  Component Value Date   AST 24 10/09/2012   Lab Results  Component Value Date   ALT 21 10/09/2012   Urinalysis Unremarkable.  See EPIC.   Pertinent Imaging: CLINICAL DATA:  Microscopic hematuria  EXAM: CT ABDOMEN AND PELVIS WITHOUT CONTRAST  TECHNIQUE: Multidetector CT imaging of the abdomen and pelvis was performed following the standard protocol without IV contrast.  COMPARISON:  09/22/2005  FINDINGS: Lower chest: Moderate emphysematous disease is present. Fibrosis and scarring within the bilateral right greater than left lung bases. Stable 4 mm soft tissue nodule in the subpleural posterior right lower lobe, felt benign due to lack of interval change. No acute consolidation or effusion. Normal heart size.  Hepatobiliary: No focal liver abnormality is seen. Status post cholecystectomy. No biliary dilatation.  Pancreas: Unremarkable. No pancreatic ductal dilatation or surrounding inflammatory changes.  Spleen: Normal in size without focal abnormality.  Adrenals/Urinary Tract: Mild nodular enlargement of left adrenal gland. Right adrenal gland within normal limits. Kidneys show no hydronephrosis. Bilateral intrarenal calculi. A stone in the lower pole on the right measures 3 mm. Largest stone in the lower pole of left kidney measures 4 mm. No ureteral stones. Bladder normal.  Stomach/Bowel: The stomach is nonenlarged. No dilated small bowel. No colon wall thickening.  Vascular/Lymphatic: Aortic atherosclerosis. No enlarged abdominal or pelvic lymph nodes.  Reproductive: Status post hysterectomy. No adnexal  masses.  Other: No free air or free fluid.  Musculoskeletal: Degenerative changes of the spine. Grade 1 anterolisthesis of L5 on S1 with chronic appearing bilateral pars defect at L5.  IMPRESSION: 1. There are nonobstructing calcified stones within both kidneys. 2. Stable 4 mm soft tissue nodule in the right lower lobe, no further follow-up required. 3. Grade 1 anterolisthesis of L5 on S1 with chronic pars defect at L5.   Electronically Signed   By: Jasmine Pang M.D.   On: 05/11/2016 18:56   Assessment & Plan:    1. Gross hematuria  - no recent gross hematuria reported by facility  - CATH UA today is unremarkable  - Due to patient's age  and severe dementia - we will hold on any further urologic evaluation  - I explained to the family that any worrisome findings would be approached in a palliative manner and not curative as patient would not be good surgical candidate and would not tolerate an in office cystoscopy  - I encouraged the family not to pursue any interventions until the patient becomes symptomatic  - UA    2. Vaginal atrophy  - area of irritation has improved  - continue applying the vaginal cream three times weekly  - RTC in 3 months for exam  3. Bilateral renal stones  - no intervention warranted at this time  - Advised to contact our office or seek treatment in the ED if becomes febrile or pain/ vomiting are difficult control in order to arrange for emergent/urgent intervention  Return in about 3 months (around 10/12/2016) for exam.  These notes generated with voice recognition software. I apologize for typographical errors.  Michiel Cowboy, PA-C  [facility] [address] [place], [place] [address] [phone]

## 2016-07-13 ENCOUNTER — Ambulatory Visit (INDEPENDENT_AMBULATORY_CARE_PROVIDER_SITE_OTHER): Payer: Medicare HMO | Admitting: Urology

## 2016-07-13 ENCOUNTER — Encounter: Payer: Self-pay | Admitting: Urology

## 2016-07-13 VITALS — BP 163/75 | HR 72 | Ht 60.0 in | Wt 93.8 lb

## 2016-07-13 DIAGNOSIS — N952 Postmenopausal atrophic vaginitis: Secondary | ICD-10-CM

## 2016-07-13 DIAGNOSIS — R310 Gross hematuria: Secondary | ICD-10-CM | POA: Diagnosis not present

## 2016-07-13 NOTE — Progress Notes (Signed)
In and Out Catheterization  Patient is present today for a I & O catheterization due to needing a clean catch specimen. Patient was cleaned and prepped in a sterile fashion with betadine and Lidocaine 2% jelly was instilled into the urethra.  A 14FR cath was inserted no complications were noted , 50ml of urine return was noted, urine was yellow in color. A clean urine sample was collected for urinalysis. Bladder was drained and catheter was removed with out difficulty.    Preformed by: Dallas Schimke CMA

## 2016-07-14 DIAGNOSIS — G308 Other Alzheimer's disease: Secondary | ICD-10-CM | POA: Diagnosis not present

## 2016-07-14 DIAGNOSIS — S93699A Other sprain of unspecified foot, initial encounter: Secondary | ICD-10-CM | POA: Diagnosis not present

## 2016-07-14 DIAGNOSIS — N3001 Acute cystitis with hematuria: Secondary | ICD-10-CM | POA: Diagnosis not present

## 2016-07-14 DIAGNOSIS — R609 Edema, unspecified: Secondary | ICD-10-CM | POA: Diagnosis not present

## 2016-07-14 DIAGNOSIS — M6281 Muscle weakness (generalized): Secondary | ICD-10-CM | POA: Diagnosis not present

## 2016-07-14 DIAGNOSIS — Z9181 History of falling: Secondary | ICD-10-CM | POA: Diagnosis not present

## 2016-07-14 DIAGNOSIS — Z79899 Other long term (current) drug therapy: Secondary | ICD-10-CM | POA: Diagnosis not present

## 2016-07-14 DIAGNOSIS — F0390 Unspecified dementia without behavioral disturbance: Secondary | ICD-10-CM | POA: Diagnosis not present

## 2016-07-14 DIAGNOSIS — N760 Acute vaginitis: Secondary | ICD-10-CM | POA: Diagnosis not present

## 2016-07-14 DIAGNOSIS — Z87440 Personal history of urinary (tract) infections: Secondary | ICD-10-CM | POA: Diagnosis not present

## 2016-07-14 DIAGNOSIS — Z993 Dependence on wheelchair: Secondary | ICD-10-CM | POA: Diagnosis not present

## 2016-07-14 DIAGNOSIS — J449 Chronic obstructive pulmonary disease, unspecified: Secondary | ICD-10-CM | POA: Diagnosis not present

## 2016-07-14 DIAGNOSIS — F17200 Nicotine dependence, unspecified, uncomplicated: Secondary | ICD-10-CM | POA: Diagnosis not present

## 2016-07-14 LAB — URINALYSIS, COMPLETE
Bilirubin, UA: NEGATIVE
Glucose, UA: NEGATIVE
Ketones, UA: NEGATIVE
Leukocytes, UA: NEGATIVE
NITRITE UA: NEGATIVE
PH UA: 7 (ref 5.0–7.5)
Protein, UA: NEGATIVE
Specific Gravity, UA: 1.02 (ref 1.005–1.030)
Urobilinogen, Ur: 0.2 mg/dL (ref 0.2–1.0)

## 2016-07-14 LAB — MICROSCOPIC EXAMINATION: BACTERIA UA: NONE SEEN

## 2016-07-16 DIAGNOSIS — M6281 Muscle weakness (generalized): Secondary | ICD-10-CM | POA: Diagnosis not present

## 2016-07-16 DIAGNOSIS — F17200 Nicotine dependence, unspecified, uncomplicated: Secondary | ICD-10-CM | POA: Diagnosis not present

## 2016-07-16 DIAGNOSIS — Z87440 Personal history of urinary (tract) infections: Secondary | ICD-10-CM | POA: Diagnosis not present

## 2016-07-16 DIAGNOSIS — F0390 Unspecified dementia without behavioral disturbance: Secondary | ICD-10-CM | POA: Diagnosis not present

## 2016-07-16 DIAGNOSIS — J449 Chronic obstructive pulmonary disease, unspecified: Secondary | ICD-10-CM | POA: Diagnosis not present

## 2016-07-16 DIAGNOSIS — Z9181 History of falling: Secondary | ICD-10-CM | POA: Diagnosis not present

## 2016-07-19 DIAGNOSIS — J449 Chronic obstructive pulmonary disease, unspecified: Secondary | ICD-10-CM | POA: Diagnosis not present

## 2016-07-19 DIAGNOSIS — R609 Edema, unspecified: Secondary | ICD-10-CM | POA: Diagnosis not present

## 2016-07-19 DIAGNOSIS — Z993 Dependence on wheelchair: Secondary | ICD-10-CM | POA: Diagnosis not present

## 2016-07-19 DIAGNOSIS — Z87440 Personal history of urinary (tract) infections: Secondary | ICD-10-CM | POA: Diagnosis not present

## 2016-07-19 DIAGNOSIS — F0390 Unspecified dementia without behavioral disturbance: Secondary | ICD-10-CM | POA: Diagnosis not present

## 2016-07-19 DIAGNOSIS — Z79899 Other long term (current) drug therapy: Secondary | ICD-10-CM | POA: Diagnosis not present

## 2016-07-19 DIAGNOSIS — F17200 Nicotine dependence, unspecified, uncomplicated: Secondary | ICD-10-CM | POA: Diagnosis not present

## 2016-07-19 DIAGNOSIS — M6281 Muscle weakness (generalized): Secondary | ICD-10-CM | POA: Diagnosis not present

## 2016-07-19 DIAGNOSIS — R451 Restlessness and agitation: Secondary | ICD-10-CM | POA: Diagnosis not present

## 2016-07-19 DIAGNOSIS — G308 Other Alzheimer's disease: Secondary | ICD-10-CM | POA: Diagnosis not present

## 2016-07-19 DIAGNOSIS — Z9181 History of falling: Secondary | ICD-10-CM | POA: Diagnosis not present

## 2016-07-19 DIAGNOSIS — F0281 Dementia in other diseases classified elsewhere with behavioral disturbance: Secondary | ICD-10-CM | POA: Diagnosis not present

## 2016-07-23 DIAGNOSIS — M6281 Muscle weakness (generalized): Secondary | ICD-10-CM | POA: Diagnosis not present

## 2016-07-23 DIAGNOSIS — J449 Chronic obstructive pulmonary disease, unspecified: Secondary | ICD-10-CM | POA: Diagnosis not present

## 2016-07-23 DIAGNOSIS — F0390 Unspecified dementia without behavioral disturbance: Secondary | ICD-10-CM | POA: Diagnosis not present

## 2016-07-23 DIAGNOSIS — F17200 Nicotine dependence, unspecified, uncomplicated: Secondary | ICD-10-CM | POA: Diagnosis not present

## 2016-07-23 DIAGNOSIS — Z9181 History of falling: Secondary | ICD-10-CM | POA: Diagnosis not present

## 2016-07-23 DIAGNOSIS — Z87440 Personal history of urinary (tract) infections: Secondary | ICD-10-CM | POA: Diagnosis not present

## 2016-07-26 DIAGNOSIS — J449 Chronic obstructive pulmonary disease, unspecified: Secondary | ICD-10-CM | POA: Diagnosis not present

## 2016-07-26 DIAGNOSIS — M6281 Muscle weakness (generalized): Secondary | ICD-10-CM | POA: Diagnosis not present

## 2016-07-26 DIAGNOSIS — F17200 Nicotine dependence, unspecified, uncomplicated: Secondary | ICD-10-CM | POA: Diagnosis not present

## 2016-07-26 DIAGNOSIS — Z9181 History of falling: Secondary | ICD-10-CM | POA: Diagnosis not present

## 2016-07-26 DIAGNOSIS — F0390 Unspecified dementia without behavioral disturbance: Secondary | ICD-10-CM | POA: Diagnosis not present

## 2016-07-26 DIAGNOSIS — Z87440 Personal history of urinary (tract) infections: Secondary | ICD-10-CM | POA: Diagnosis not present

## 2016-07-30 DIAGNOSIS — M6281 Muscle weakness (generalized): Secondary | ICD-10-CM | POA: Diagnosis not present

## 2016-07-30 DIAGNOSIS — Z9181 History of falling: Secondary | ICD-10-CM | POA: Diagnosis not present

## 2016-07-30 DIAGNOSIS — Z87440 Personal history of urinary (tract) infections: Secondary | ICD-10-CM | POA: Diagnosis not present

## 2016-07-30 DIAGNOSIS — F17200 Nicotine dependence, unspecified, uncomplicated: Secondary | ICD-10-CM | POA: Diagnosis not present

## 2016-07-30 DIAGNOSIS — M545 Low back pain: Secondary | ICD-10-CM | POA: Diagnosis not present

## 2016-07-30 DIAGNOSIS — R609 Edema, unspecified: Secondary | ICD-10-CM | POA: Diagnosis not present

## 2016-07-30 DIAGNOSIS — J449 Chronic obstructive pulmonary disease, unspecified: Secondary | ICD-10-CM | POA: Diagnosis not present

## 2016-07-30 DIAGNOSIS — F0390 Unspecified dementia without behavioral disturbance: Secondary | ICD-10-CM | POA: Diagnosis not present

## 2016-07-31 ENCOUNTER — Emergency Department
Admission: EM | Admit: 2016-07-31 | Discharge: 2016-07-31 | Disposition: A | Discharge status: Home / Self Care | Payer: Medicare HMO | Attending: Emergency Medicine | Admitting: Emergency Medicine

## 2016-07-31 ENCOUNTER — Encounter: Payer: Self-pay | Admitting: *Deleted

## 2016-07-31 ENCOUNTER — Emergency Department: Payer: Medicare HMO

## 2016-07-31 DIAGNOSIS — W231XXA Caught, crushed, jammed, or pinched between stationary objects, initial encounter: Secondary | ICD-10-CM | POA: Diagnosis not present

## 2016-07-31 DIAGNOSIS — S62623A Displaced fracture of medial phalanx of left middle finger, initial encounter for closed fracture: Secondary | ICD-10-CM | POA: Diagnosis not present

## 2016-07-31 DIAGNOSIS — Y939 Activity, unspecified: Secondary | ICD-10-CM | POA: Insufficient documentation

## 2016-07-31 DIAGNOSIS — G301 Alzheimer's disease with late onset: Secondary | ICD-10-CM | POA: Insufficient documentation

## 2016-07-31 DIAGNOSIS — Y929 Unspecified place or not applicable: Secondary | ICD-10-CM | POA: Insufficient documentation

## 2016-07-31 DIAGNOSIS — Z87891 Personal history of nicotine dependence: Secondary | ICD-10-CM | POA: Diagnosis not present

## 2016-07-31 DIAGNOSIS — M79642 Pain in left hand: Secondary | ICD-10-CM | POA: Diagnosis not present

## 2016-07-31 DIAGNOSIS — J449 Chronic obstructive pulmonary disease, unspecified: Secondary | ICD-10-CM | POA: Diagnosis not present

## 2016-07-31 DIAGNOSIS — Z7982 Long term (current) use of aspirin: Secondary | ICD-10-CM | POA: Insufficient documentation

## 2016-07-31 DIAGNOSIS — Y999 Unspecified external cause status: Secondary | ICD-10-CM | POA: Diagnosis not present

## 2016-07-31 DIAGNOSIS — S62663A Nondisplaced fracture of distal phalanx of left middle finger, initial encounter for closed fracture: Secondary | ICD-10-CM | POA: Diagnosis not present

## 2016-07-31 DIAGNOSIS — S6992XA Unspecified injury of left wrist, hand and finger(s), initial encounter: Secondary | ICD-10-CM | POA: Diagnosis present

## 2016-07-31 DIAGNOSIS — Z79899 Other long term (current) drug therapy: Secondary | ICD-10-CM | POA: Diagnosis not present

## 2016-07-31 DIAGNOSIS — I10 Essential (primary) hypertension: Secondary | ICD-10-CM | POA: Insufficient documentation

## 2016-07-31 MED ORDER — ACETAMINOPHEN 325 MG PO TABS
650.0000 mg | ORAL_TABLET | Freq: Once | ORAL | Status: AC
  Administered 2016-07-31: 650 mg via ORAL
  Filled 2016-07-31: qty 2

## 2016-07-31 NOTE — ED Provider Notes (Signed)
[facility] Emergency Department Provider Note ____________________________________________   First MD Initiated Contact with Patient 07/31/16 [phone]     (approximate)  I have reviewed the triage vital signs and the nursing notes.   HISTORY  Chief Complaint Finger Injury   EM caveat: Patient has severe dementia, unable to recall events  HPI Allison Sullivan is a 83 y.o. female patient was transferring between wheelchair and a table, her left hand became caught in the wheelchair. Sent for evaluation for concerns of deformity of the left hand fourth and fifth digits and bruising of the third  The patient herself does have severe dementia and can't provide a history. I spoke with her daughter who had spoken with her nursing facility and noted that she had got her hand somehow caught between a wheelchair to table. Daughter reports that the patient has had deformed fingers and trigger fingers in the left hand ring and pinky finger for years, and that the nursing home may have thought those were injured, but they did note bruising over the middle finger on the left hand. The patient did not have any other injury and there is no report of her falling hitting her head or otherwise.   Past Medical History:  Diagnosis Date  . Anxiety   . Arthritis   . COPD (chronic obstructive pulmonary disease) (HCC)    Documented  . Degenerative joint disease    degerative disc disease  . Dementia   . Depression   . Hyperlipidemia   . Hypertension   . Osteoporosis   . Spondylolisthesis    with foraminal stenosis  . Stroke [facility])    mini stroke-2 yrs ago  . Thyroid disease 1979   Thyroid lesion    Patient Active Problem List   Diagnosis Date Noted  . COPD (chronic obstructive pulmonary disease) (HCC) 06/14/2016  . Dementia 06/14/2016  . DJD (degenerative joint disease) 06/14/2016  . Hypertension 06/14/2016  . Osteoporosis 06/14/2016  . Tobacco abuse 06/14/2016  . Hip  pain 10/08/2015  . Late onset Alzheimer's disease without behavioral disturbance 11/25/2014  . Trigger ring finger of left hand 08/23/2014    Past Surgical History:  Procedure Laterality Date  . ABDOMINAL HYSTERECTOMY  1997  . APPENDECTOMY  1979  . CHOLECYSTECTOMY  2002  . EXCISION OF ADNEXAL MASS Left   . EYE SURGERY Bilateral    Cataract  . FOOT SURGERY Right   . THYROID SURGERY     thyroid lesion resection  . TRIGGER FINGER RELEASE Left 09/11/2014   Procedure: RELEASE TRIGGER FINGER/A-1 PULLEY;  Surgeon: Christena Flake, MD;  Location: [facility];  Service: Orthopedics;  Laterality: Left;  RISK FOR FALLS    Prior to Admission medications   Medication Sig Start Date End Date Taking? Authorizing Provider  albuterol (PROVENTIL) (2.5 MG/3ML) 0.083% nebulizer solution Take 3 mLs by nebulization every 6 (six) hours as needed for wheezing. 04/29/16 04/29/17  Historical Provider, MD  aspirin EC 81 MG tablet Take 81 mg by mouth daily.    Historical Provider, MD  Calcium Carbonate-Vitamin D (CALTRATE 600+D PO) Take 1 tablet by mouth 2 (two) times daily. sporatic    Historical Provider, MD  clonazePAM (KLONOPIN) 0.5 MG tablet Take 0.5 mg by mouth as needed.  05/10/16   Historical Provider, MD  cyanocobalamin (,VITAMIN B-12,) 1000 MCG/ML injection Inject 1,000 mcg into the muscle every 30 (thirty) days.  04/20/16   Historical Provider, MD  doxycycline (VIBRAMYCIN) 100 MG capsule Take  1 capsule (100 mg total) by mouth every 12 (twelve) hours. Patient not taking: Reported on 07/13/2016 06/28/16   Carollee Herter A McGowan, PA-C  erythromycin (E-MYCIN) 250 MG tablet Take 1 tablet (250 mg total) by mouth 4 (four) times daily. Patient not taking: Reported on 07/13/2016 06/18/16   Harle Battiest, PA-C  estradiol (ESTRACE VAGINAL) 0.1 MG/GM vaginal cream Apply 0.5mg  (pea-sized amount)  just inside the vaginal introitus with a finger-tip on  Monday, Wednesday and Friday nights. 05/31/16   Harle Battiest, PA-C   sertraline (ZOLOFT) 25 MG tablet Take 25 mg by mouth daily. 05/13/16 05/13/17  Historical Provider, MD    Allergies Patient has no known allergies.  Family History  Problem Relation Age of Onset  . Heart disease Mother 50    died of attack  . Arthritis Mother   . Hyperlipidemia Mother   . Heart disease Father 38    died of heart attack  . Prostate cancer Neg Hx   . Kidney cancer Neg Hx   . Bladder Cancer Neg Hx     Social History Social History  Substance Use Topics  . Smoking status: Former Smoker    Packs/day: 1.00    Years: 60.00    Types: Cigarettes  . Smokeless tobacco: Never Used     Comment: quit 04/14/2016  . Alcohol use No    Review of Systems EM caveat  Patient reports the only discomfort she has a slight amount of pain in the middle of her left finger whether is some bruising noted.  ____________________________________________   PHYSICAL EXAM:  VITAL SIGNS: ED Triage Vitals  Enc Vitals Group     BP 07/31/16 1904 (!) 182/74     Pulse Rate 07/31/16 1904 72     Resp 07/31/16 1904 20     Temp 07/31/16 1904 97.7 F (36.5 C)     Temp Source 07/31/16 1904 Oral     SpO2 07/31/16 1904 95 %     Weight 07/31/16 1905 95 lb (43.1 kg)     Height --      Head Circumference --      Peak Flow --      Pain Score --      Pain Loc --      Pain Edu? --      Excl. in GC? --     Constitutional: Alert and oriented To self but not to place or situation. Well appearing and in no acute distress. Eyes: Conjunctivae are normal. PERRL. EOMI. Head: Atraumatic. Nose: No congestion/rhinnorhea. Mouth/Throat: Mucous membranes are moist.  Oropharynx non-erythematous. Neck: No stridor.   Cardiovascular: Normal rate, regular rhythm. Grossly normal heart sounds.  Good peripheral circulation. Respiratory: Normal respiratory effort.  No retractions. Lungs CTAB. Musculoskeletal:   Right hand Median, ulnar, radial motor intact. Cap refill less than 2 seconds all digits. Strong  radial pulse. 5 out of 5 strength throughout the hand intrinsics, flexion and extension at the wrist. No evidence of trauma.  Left hand Median, ulnar, radial motor intact. Cap refill less than 2 seconds all digits. Strong radial pulse. 5 out of 5 strength throughout the hand intrinsics, flexion and extension at the wrist. No evidence of trauma except for mild bruising and slight swelling of the PIP joint of the third digit, in addition she has deformities of the DIP and PIP joints of both the fourth and fifth digits, however no swelling and denies pain. These appear somewhat chronic in nature, with no evidence of  an acute deformity. ____________________________________________   No lower extremity tenderness nor edema.  No joint effusions. Neurologic:  Normal speech and language. No gross focal neurologic deficits are appreciated. No gait instability. Skin:  Skin is warm, dry and intact. No rash noted. Psychiatric: Mood and affect are normal. Speech and behavior are normal.  ____________________________________________   LABS (all labs ordered are listed, but only abnormal results are displayed)  Labs Reviewed - No data to display ____________________________________________  EKG   ____________________________________________  RADIOLOGY  Dg Hand Complete Left  Result Date: 07/31/2016 CLINICAL DATA:  Injury with bruising to the right ring finger. EXAM: LEFT HAND - COMPLETE 3+ VIEW COMPARISON:  05/14/2016 FINDINGS: The history mentions the right ring finger. Exam is labeled as the left hand. There is soft tissue swelling surrounding the PIP joint of the left middle finger. There is apparent subtle avulsion fracture from the volar base of the middle phalanx of the middle finger at the PIP joint. No other evidence of a fracture. There is a chronic flexion deformity of the PIP joint of the fifth finger. Bones are demineralized.  The joints are normally aligned. IMPRESSION: 1. Subtle small  avulsion fracture from the volar base of the middle phalanx of the middle finger at the PIP joint with surrounding soft tissue swelling. 2. No other acute abnormality. Electronically Signed   By: Amie Portland M.D.   On: 07/31/2016 19:39    ____________________________________________   PROCEDURES  Procedure(s) performed: None  Procedures  Critical Care performed: No  ____________________________________________   INITIAL IMPRESSION / ASSESSMENT AND PLAN / ED COURSE  Pertinent labs & imaging results that were available during my care of the patient were reviewed by me and considered in my medical decision making (see chart for details).  Patient presents for evaluation after catching her left hand between wheelchair and table. She has deformity of the left fourth and fifth digits, but in discussion with the daughter and also with her son who is present, it appears these are chronic deformities and x-ray does not support an acute change here. She does however have a small fracture at the base of the middle phalanx of the left third digit, and I discussed this with her daughter and son. They are agreeable with plan for splinting, but do note the patient will likely pick it off and try to remove it. Discussed with Dr. Martha Clan orthopedics, he is agreeable to following up with her and notes that splinting is ideal, but if the patient continues to remove her splint that he would likely healing would occur irregardless.  Family agreeable with plan to attempt splinting, and will follow-up closely with orthopedics. No evidence of other injury or any evidence of acute neurologic or vascular compromise.      ____________________________________________   FINAL CLINICAL IMPRESSION(S) / ED DIAGNOSES  Final diagnoses:  Nondisplaced fracture of distal phalanx of left middle finger, initial encounter for closed fracture      NEW MEDICATIONS STARTED DURING THIS VISIT:  New Prescriptions    No medications on file     Note:  This document was prepared using Dragon voice recognition software and may include unintentional dictation errors.     Sharyn Creamer, MD 07/31/16 2028

## 2016-07-31 NOTE — ED Triage Notes (Signed)
Pr was transferring self to a wheelchair, caught last 3 digits of L hand in the spokes of the chair. Pt presents w/ deformity to 4th and 5th L digits.

## 2016-08-02 DIAGNOSIS — Z79899 Other long term (current) drug therapy: Secondary | ICD-10-CM | POA: Diagnosis not present

## 2016-08-02 DIAGNOSIS — Z993 Dependence on wheelchair: Secondary | ICD-10-CM | POA: Diagnosis not present

## 2016-08-02 DIAGNOSIS — R609 Edema, unspecified: Secondary | ICD-10-CM | POA: Diagnosis not present

## 2016-08-02 DIAGNOSIS — F0281 Dementia in other diseases classified elsewhere with behavioral disturbance: Secondary | ICD-10-CM | POA: Diagnosis not present

## 2016-08-02 DIAGNOSIS — M6281 Muscle weakness (generalized): Secondary | ICD-10-CM | POA: Diagnosis not present

## 2016-08-02 DIAGNOSIS — M84444D Pathological fracture, right finger(s), subsequent encounter for fracture with routine healing: Secondary | ICD-10-CM | POA: Diagnosis not present

## 2016-08-02 DIAGNOSIS — G308 Other Alzheimer's disease: Secondary | ICD-10-CM | POA: Diagnosis not present

## 2016-08-02 DIAGNOSIS — R451 Restlessness and agitation: Secondary | ICD-10-CM | POA: Diagnosis not present

## 2016-08-05 DIAGNOSIS — M6281 Muscle weakness (generalized): Secondary | ICD-10-CM | POA: Diagnosis not present

## 2016-08-05 DIAGNOSIS — Z9181 History of falling: Secondary | ICD-10-CM | POA: Diagnosis not present

## 2016-08-05 DIAGNOSIS — Z87440 Personal history of urinary (tract) infections: Secondary | ICD-10-CM | POA: Diagnosis not present

## 2016-08-05 DIAGNOSIS — F17200 Nicotine dependence, unspecified, uncomplicated: Secondary | ICD-10-CM | POA: Diagnosis not present

## 2016-08-05 DIAGNOSIS — J449 Chronic obstructive pulmonary disease, unspecified: Secondary | ICD-10-CM | POA: Diagnosis not present

## 2016-08-05 DIAGNOSIS — F0390 Unspecified dementia without behavioral disturbance: Secondary | ICD-10-CM | POA: Diagnosis not present

## 2016-08-11 DIAGNOSIS — M6281 Muscle weakness (generalized): Secondary | ICD-10-CM | POA: Diagnosis not present

## 2016-08-11 DIAGNOSIS — F0390 Unspecified dementia without behavioral disturbance: Secondary | ICD-10-CM | POA: Diagnosis not present

## 2016-08-11 DIAGNOSIS — M25551 Pain in right hip: Secondary | ICD-10-CM | POA: Diagnosis not present

## 2016-08-11 DIAGNOSIS — R52 Pain, unspecified: Secondary | ICD-10-CM | POA: Diagnosis not present

## 2016-08-11 DIAGNOSIS — Z5189 Encounter for other specified aftercare: Secondary | ICD-10-CM | POA: Diagnosis not present

## 2016-08-16 DIAGNOSIS — M6281 Muscle weakness (generalized): Secondary | ICD-10-CM | POA: Diagnosis not present

## 2016-08-16 DIAGNOSIS — G308 Other Alzheimer's disease: Secondary | ICD-10-CM | POA: Diagnosis not present

## 2016-08-16 DIAGNOSIS — F0281 Dementia in other diseases classified elsewhere with behavioral disturbance: Secondary | ICD-10-CM | POA: Diagnosis not present

## 2016-08-16 DIAGNOSIS — Z993 Dependence on wheelchair: Secondary | ICD-10-CM | POA: Diagnosis not present

## 2016-08-16 DIAGNOSIS — S40812A Abrasion of left upper arm, initial encounter: Secondary | ICD-10-CM | POA: Diagnosis not present

## 2016-08-16 DIAGNOSIS — Z79899 Other long term (current) drug therapy: Secondary | ICD-10-CM | POA: Diagnosis not present

## 2016-08-16 DIAGNOSIS — R451 Restlessness and agitation: Secondary | ICD-10-CM | POA: Diagnosis not present

## 2016-08-16 DIAGNOSIS — R609 Edema, unspecified: Secondary | ICD-10-CM | POA: Diagnosis not present

## 2016-08-17 DIAGNOSIS — Z79899 Other long term (current) drug therapy: Secondary | ICD-10-CM | POA: Diagnosis not present

## 2016-08-19 DIAGNOSIS — F0281 Dementia in other diseases classified elsewhere with behavioral disturbance: Secondary | ICD-10-CM | POA: Diagnosis not present

## 2016-08-19 DIAGNOSIS — R451 Restlessness and agitation: Secondary | ICD-10-CM | POA: Diagnosis not present

## 2016-08-19 DIAGNOSIS — G309 Alzheimer's disease, unspecified: Secondary | ICD-10-CM | POA: Diagnosis not present

## 2016-08-19 DIAGNOSIS — Z993 Dependence on wheelchair: Secondary | ICD-10-CM | POA: Diagnosis not present

## 2016-08-19 DIAGNOSIS — S2232XA Fracture of one rib, left side, initial encounter for closed fracture: Secondary | ICD-10-CM | POA: Diagnosis not present

## 2016-08-19 DIAGNOSIS — S81812D Laceration without foreign body, left lower leg, subsequent encounter: Secondary | ICD-10-CM | POA: Diagnosis not present

## 2016-08-19 DIAGNOSIS — M6281 Muscle weakness (generalized): Secondary | ICD-10-CM | POA: Diagnosis not present

## 2016-08-19 DIAGNOSIS — R609 Edema, unspecified: Secondary | ICD-10-CM | POA: Diagnosis not present

## 2016-08-19 DIAGNOSIS — G308 Other Alzheimer's disease: Secondary | ICD-10-CM | POA: Diagnosis not present

## 2016-08-19 DIAGNOSIS — R079 Chest pain, unspecified: Secondary | ICD-10-CM | POA: Diagnosis not present

## 2016-08-20 DIAGNOSIS — G309 Alzheimer's disease, unspecified: Secondary | ICD-10-CM | POA: Diagnosis not present

## 2016-08-21 DIAGNOSIS — G309 Alzheimer's disease, unspecified: Secondary | ICD-10-CM | POA: Diagnosis not present

## 2016-08-22 DIAGNOSIS — G309 Alzheimer's disease, unspecified: Secondary | ICD-10-CM | POA: Diagnosis not present

## 2016-08-23 DIAGNOSIS — G309 Alzheimer's disease, unspecified: Secondary | ICD-10-CM | POA: Diagnosis not present

## 2016-08-24 DIAGNOSIS — G309 Alzheimer's disease, unspecified: Secondary | ICD-10-CM | POA: Diagnosis not present

## 2016-08-25 DIAGNOSIS — G309 Alzheimer's disease, unspecified: Secondary | ICD-10-CM | POA: Diagnosis not present

## 2016-08-26 DIAGNOSIS — G309 Alzheimer's disease, unspecified: Secondary | ICD-10-CM | POA: Diagnosis not present

## 2016-08-27 DIAGNOSIS — G309 Alzheimer's disease, unspecified: Secondary | ICD-10-CM | POA: Diagnosis not present

## 2016-08-28 DIAGNOSIS — G309 Alzheimer's disease, unspecified: Secondary | ICD-10-CM | POA: Diagnosis not present

## 2016-08-29 DIAGNOSIS — G309 Alzheimer's disease, unspecified: Secondary | ICD-10-CM | POA: Diagnosis not present

## 2016-08-30 DIAGNOSIS — G309 Alzheimer's disease, unspecified: Secondary | ICD-10-CM | POA: Diagnosis not present

## 2016-08-31 DIAGNOSIS — G309 Alzheimer's disease, unspecified: Secondary | ICD-10-CM | POA: Diagnosis not present

## 2016-09-01 DIAGNOSIS — G309 Alzheimer's disease, unspecified: Secondary | ICD-10-CM | POA: Diagnosis not present

## 2016-09-03 DIAGNOSIS — G309 Alzheimer's disease, unspecified: Secondary | ICD-10-CM | POA: Diagnosis not present

## 2016-09-04 DIAGNOSIS — G309 Alzheimer's disease, unspecified: Secondary | ICD-10-CM | POA: Diagnosis not present

## 2016-09-04 DIAGNOSIS — Z79899 Other long term (current) drug therapy: Secondary | ICD-10-CM | POA: Diagnosis not present

## 2016-09-05 DIAGNOSIS — G309 Alzheimer's disease, unspecified: Secondary | ICD-10-CM | POA: Diagnosis not present

## 2016-09-06 DIAGNOSIS — G309 Alzheimer's disease, unspecified: Secondary | ICD-10-CM | POA: Diagnosis not present

## 2016-09-07 DIAGNOSIS — G309 Alzheimer's disease, unspecified: Secondary | ICD-10-CM | POA: Diagnosis not present

## 2016-09-08 DIAGNOSIS — G309 Alzheimer's disease, unspecified: Secondary | ICD-10-CM | POA: Diagnosis not present

## 2016-09-09 DIAGNOSIS — Z79899 Other long term (current) drug therapy: Secondary | ICD-10-CM | POA: Diagnosis not present

## 2016-09-09 DIAGNOSIS — M6281 Muscle weakness (generalized): Secondary | ICD-10-CM | POA: Diagnosis not present

## 2016-09-09 DIAGNOSIS — F0281 Dementia in other diseases classified elsewhere with behavioral disturbance: Secondary | ICD-10-CM | POA: Diagnosis not present

## 2016-09-09 DIAGNOSIS — R05 Cough: Secondary | ICD-10-CM | POA: Diagnosis not present

## 2016-09-09 DIAGNOSIS — Z993 Dependence on wheelchair: Secondary | ICD-10-CM | POA: Diagnosis not present

## 2016-09-09 DIAGNOSIS — G309 Alzheimer's disease, unspecified: Secondary | ICD-10-CM | POA: Diagnosis not present

## 2016-09-09 DIAGNOSIS — R451 Restlessness and agitation: Secondary | ICD-10-CM | POA: Diagnosis not present

## 2016-09-09 DIAGNOSIS — S2232XA Fracture of one rib, left side, initial encounter for closed fracture: Secondary | ICD-10-CM | POA: Diagnosis not present

## 2016-09-09 DIAGNOSIS — N390 Urinary tract infection, site not specified: Secondary | ICD-10-CM | POA: Diagnosis not present

## 2016-09-09 DIAGNOSIS — R609 Edema, unspecified: Secondary | ICD-10-CM | POA: Diagnosis not present

## 2016-09-10 DIAGNOSIS — G309 Alzheimer's disease, unspecified: Secondary | ICD-10-CM | POA: Diagnosis not present

## 2016-09-11 DIAGNOSIS — Z5189 Encounter for other specified aftercare: Secondary | ICD-10-CM | POA: Diagnosis not present

## 2016-09-11 DIAGNOSIS — R52 Pain, unspecified: Secondary | ICD-10-CM | POA: Diagnosis not present

## 2016-09-11 DIAGNOSIS — M6281 Muscle weakness (generalized): Secondary | ICD-10-CM | POA: Diagnosis not present

## 2016-09-11 DIAGNOSIS — M25551 Pain in right hip: Secondary | ICD-10-CM | POA: Diagnosis not present

## 2016-09-11 DIAGNOSIS — F0390 Unspecified dementia without behavioral disturbance: Secondary | ICD-10-CM | POA: Diagnosis not present

## 2016-09-11 DIAGNOSIS — G309 Alzheimer's disease, unspecified: Secondary | ICD-10-CM | POA: Diagnosis not present

## 2016-09-12 DIAGNOSIS — G309 Alzheimer's disease, unspecified: Secondary | ICD-10-CM | POA: Diagnosis not present

## 2016-09-13 DIAGNOSIS — G309 Alzheimer's disease, unspecified: Secondary | ICD-10-CM | POA: Diagnosis not present

## 2016-09-14 DIAGNOSIS — G309 Alzheimer's disease, unspecified: Secondary | ICD-10-CM | POA: Diagnosis not present

## 2016-09-15 DIAGNOSIS — G309 Alzheimer's disease, unspecified: Secondary | ICD-10-CM | POA: Diagnosis not present

## 2016-09-16 DIAGNOSIS — R609 Edema, unspecified: Secondary | ICD-10-CM | POA: Diagnosis not present

## 2016-09-16 DIAGNOSIS — F0281 Dementia in other diseases classified elsewhere with behavioral disturbance: Secondary | ICD-10-CM | POA: Diagnosis not present

## 2016-09-16 DIAGNOSIS — G309 Alzheimer's disease, unspecified: Secondary | ICD-10-CM | POA: Diagnosis not present

## 2016-09-16 DIAGNOSIS — R451 Restlessness and agitation: Secondary | ICD-10-CM | POA: Diagnosis not present

## 2016-09-16 DIAGNOSIS — R269 Unspecified abnormalities of gait and mobility: Secondary | ICD-10-CM | POA: Diagnosis not present

## 2016-09-16 DIAGNOSIS — G308 Other Alzheimer's disease: Secondary | ICD-10-CM | POA: Diagnosis not present

## 2016-09-16 DIAGNOSIS — J449 Chronic obstructive pulmonary disease, unspecified: Secondary | ICD-10-CM | POA: Diagnosis not present

## 2016-09-17 DIAGNOSIS — G309 Alzheimer's disease, unspecified: Secondary | ICD-10-CM | POA: Diagnosis not present

## 2016-09-18 DIAGNOSIS — G309 Alzheimer's disease, unspecified: Secondary | ICD-10-CM | POA: Diagnosis not present

## 2016-09-19 DIAGNOSIS — G309 Alzheimer's disease, unspecified: Secondary | ICD-10-CM | POA: Diagnosis not present

## 2016-09-20 DIAGNOSIS — G309 Alzheimer's disease, unspecified: Secondary | ICD-10-CM | POA: Diagnosis not present

## 2016-09-21 DIAGNOSIS — G309 Alzheimer's disease, unspecified: Secondary | ICD-10-CM | POA: Diagnosis not present

## 2016-09-22 DIAGNOSIS — G309 Alzheimer's disease, unspecified: Secondary | ICD-10-CM | POA: Diagnosis not present

## 2016-09-23 DIAGNOSIS — G309 Alzheimer's disease, unspecified: Secondary | ICD-10-CM | POA: Diagnosis not present

## 2016-09-24 DIAGNOSIS — Z79899 Other long term (current) drug therapy: Secondary | ICD-10-CM | POA: Diagnosis not present

## 2016-09-24 DIAGNOSIS — G309 Alzheimer's disease, unspecified: Secondary | ICD-10-CM | POA: Diagnosis not present

## 2016-09-25 DIAGNOSIS — G309 Alzheimer's disease, unspecified: Secondary | ICD-10-CM | POA: Diagnosis not present

## 2016-09-26 DIAGNOSIS — G309 Alzheimer's disease, unspecified: Secondary | ICD-10-CM | POA: Diagnosis not present

## 2016-09-27 DIAGNOSIS — G309 Alzheimer's disease, unspecified: Secondary | ICD-10-CM | POA: Diagnosis not present

## 2016-09-28 DIAGNOSIS — G309 Alzheimer's disease, unspecified: Secondary | ICD-10-CM | POA: Diagnosis not present

## 2016-09-29 DIAGNOSIS — G309 Alzheimer's disease, unspecified: Secondary | ICD-10-CM | POA: Diagnosis not present

## 2016-09-30 DIAGNOSIS — G309 Alzheimer's disease, unspecified: Secondary | ICD-10-CM | POA: Diagnosis not present

## 2016-10-01 DIAGNOSIS — G309 Alzheimer's disease, unspecified: Secondary | ICD-10-CM | POA: Diagnosis not present

## 2016-10-02 DIAGNOSIS — G309 Alzheimer's disease, unspecified: Secondary | ICD-10-CM | POA: Diagnosis not present

## 2016-10-03 DIAGNOSIS — G309 Alzheimer's disease, unspecified: Secondary | ICD-10-CM | POA: Diagnosis not present

## 2016-10-03 NOTE — Progress Notes (Deleted)
10/04/2016 7:36 PM   Allison Sullivan Sep 15, 1930 [id number]  Referring provider: Patrice Paradise, MD [address] [place] [facility] [address]  No chief complaint on file.   HPI: 83 yo WF with dementia, history of hematuria, nephrolithiasis and vaginal atrophy who presents today for a 3 month follow up.      History of hematuria Patient was found to have microscopic hematuria on 05/14/2016 with 6-30 RBC's/hpf.  She underwent a non contrast CT on 05/13/2016 which noted there are nonobstructing calcified stones within both kidneys.  Stable 4 mm soft tissue nodule in the right lower lobe, no further follow-up required.  Grade 1 anterolisthesis of L5 on S1 with chronic pars defect at L5.  There has not been any reports of gross hematuria.  Nephrolithiasis Non contrast CT on 05/13/2016 which noted there are nonobstructing calcified stones within both kidneys.  There have been no reports of the patient's complaining of flank pain or gross hematuria.  Vaginal atrophy ***   PMH: Past Medical History:  Diagnosis Date  . Anxiety   . Arthritis   . COPD (chronic obstructive pulmonary disease) (HCC)    Documented  . Degenerative joint disease    degerative disc disease  . Dementia   . Depression   . Hyperlipidemia   . Hypertension   . Osteoporosis   . Spondylolisthesis    with foraminal stenosis  . Stroke [facility])    mini stroke-2 yrs ago  . Thyroid disease 1979   Thyroid lesion    Surgical History: Past Surgical History:  Procedure Laterality Date  . ABDOMINAL HYSTERECTOMY  1997  . APPENDECTOMY  1979  . CHOLECYSTECTOMY  2002  . EXCISION OF ADNEXAL MASS Left   . EYE SURGERY Bilateral    Cataract  . FOOT SURGERY Right   . THYROID SURGERY     thyroid lesion resection  . TRIGGER FINGER RELEASE Left 09/11/2014   Procedure: RELEASE TRIGGER FINGER/A-1 PULLEY;  Surgeon: Christena Flake, MD;  Location: [facility];  Service: Orthopedics;   Laterality: Left;  RISK FOR FALLS    Home Medications:  Allergies as of 10/04/2016   No Known Allergies     Medication List       Accurate as of 10/03/16  7:36 PM. Always use your most recent med list.          albuterol (2.5 MG/3ML) 0.083% nebulizer solution Commonly known as:  PROVENTIL Take 3 mLs by nebulization every 6 (six) hours as needed for wheezing.   aspirin EC 81 MG tablet Take 81 mg by mouth daily.   CALTRATE 600+D PO Take 1 tablet by mouth 2 (two) times daily. sporatic   clonazePAM 0.5 MG tablet Commonly known as:  KLONOPIN Take 0.5 mg by mouth as needed.   cyanocobalamin 1000 MCG/ML injection Commonly known as:  (VITAMIN B-12) Inject 1,000 mcg into the muscle every 30 (thirty) days.   doxycycline 100 MG capsule Commonly known as:  VIBRAMYCIN Take 1 capsule (100 mg total) by mouth every 12 (twelve) hours.   erythromycin 250 MG tablet Commonly known as:  E-MYCIN Take 1 tablet (250 mg total) by mouth 4 (four) times daily.   estradiol 0.1 MG/GM vaginal cream Commonly known as:  ESTRACE VAGINAL Apply 0.5mg  (pea-sized amount)  just inside the vaginal introitus with a finger-tip on  Monday, Wednesday and Friday nights.   sertraline 25 MG tablet Commonly known as:  ZOLOFT Take 25 mg by mouth daily.  Allergies: No Known Allergies  Family History: Family History  Problem Relation Age of Onset  . Heart disease Mother 6685       died of attack  . Arthritis Mother   . Hyperlipidemia Mother   . Heart disease Father 3564       died of heart attack  . Prostate cancer Neg Hx   . Kidney cancer Neg Hx   . Bladder Cancer Neg Hx     Social History:  reports that she has quit smoking. Her smoking use included Cigarettes. She has a 60.00 pack-year smoking history. She has never used smokeless tobacco. She reports that she does not drink alcohol or use drugs.  ROS:                                        Physical Exam: There were no  vitals taken for this visit.  Constitutional: Cachectic.  Somnolent. Not orientated. HEENT: [place] AT, moist mucus membranes. Trachea midline, no masses. Cardiovascular: No clubbing, cyanosis, or edema. Respiratory: Normal respiratory effort, no increased work of breathing. GI: Abdomen is soft, non tender, non distended, no abdominal masses. Liver and spleen not palpable.  No hernias appreciated.  Stool sample for occult testing is not indicated.   GU: No CVA tenderness.  No bladder fullness or masses.  Atrophic external genitalia, normal pubic hair distribution, no lesions.  Normal urethral meatus, no lesions, no prolapse, no discharge. Urethral caruncle is noted.  No urethral masses, tenderness and/or tenderness. No bladder fullness, tenderness or masses. Pale vagina mucosa, poor estrogen effect, no discharge, no lesions, good pelvic support, no cystocele or rectocele noted.  The area of irritation in the introitus reduced in size to 4 mm x 4 mm.  Cervix, uterus and adnexa are surgically absent.  Anus and perineum are without rashes or lesions.    Skin: No rashes, bruises or suspicious lesions. Lymph: No cervical or inguinal adenopathy. Neurologic: Grossly intact, no focal deficits, moving all 4 extremities. Psychiatric: Normal mood and affect.  Laboratory Data: Lab Results  Component Value Date   WBC 7.9 05/14/2016   HGB 13.9 05/14/2016   HCT 41.4 05/14/2016   MCV 94.4 05/14/2016   PLT 269 05/14/2016    Lab Results  Component Value Date   CREATININE 0.61 05/31/2016   I personally reviewed the labs.  Urinalysis ***  Pertinent Imaging: ***  Assessment & Plan:    1. History of hematuria  - no recent gross hematuria reported by facility     2. Vaginal atrophy  - area of irritation has improved  - continue applying the vaginal cream three times weekly  - RTC in 3 months for exam  3. Bilateral renal stones  - no intervention warranted at this time  - Advised to contact our  office or seek treatment in the ED if becomes febrile or pain/ vomiting are difficult control in order to arrange for emergent/urgent intervention  No Follow-up on file.  These notes generated with voice recognition software. I apologize for typographical errors.  Michiel CowboySHANNON Laya Letendre, PA-C  [facility] [address], [address] [place], [place] [address] [phone]

## 2016-10-04 ENCOUNTER — Ambulatory Visit: Payer: Medicare HMO | Admitting: Urology

## 2016-10-04 DIAGNOSIS — R262 Difficulty in walking, not elsewhere classified: Secondary | ICD-10-CM | POA: Diagnosis not present

## 2016-10-04 DIAGNOSIS — N390 Urinary tract infection, site not specified: Secondary | ICD-10-CM | POA: Diagnosis not present

## 2016-10-04 DIAGNOSIS — R609 Edema, unspecified: Secondary | ICD-10-CM | POA: Diagnosis not present

## 2016-10-04 DIAGNOSIS — M25531 Pain in right wrist: Secondary | ICD-10-CM | POA: Diagnosis not present

## 2016-10-04 DIAGNOSIS — R2230 Localized swelling, mass and lump, unspecified upper limb: Secondary | ICD-10-CM | POA: Diagnosis not present

## 2016-10-04 DIAGNOSIS — R451 Restlessness and agitation: Secondary | ICD-10-CM | POA: Diagnosis not present

## 2016-10-04 DIAGNOSIS — G309 Alzheimer's disease, unspecified: Secondary | ICD-10-CM | POA: Diagnosis not present

## 2016-10-04 DIAGNOSIS — R062 Wheezing: Secondary | ICD-10-CM | POA: Diagnosis not present

## 2016-10-04 DIAGNOSIS — G308 Other Alzheimer's disease: Secondary | ICD-10-CM | POA: Diagnosis not present

## 2016-10-04 DIAGNOSIS — M6281 Muscle weakness (generalized): Secondary | ICD-10-CM | POA: Diagnosis not present

## 2016-10-04 DIAGNOSIS — M79641 Pain in right hand: Secondary | ICD-10-CM | POA: Diagnosis not present

## 2016-10-05 DIAGNOSIS — S52521A Torus fracture of lower end of right radius, initial encounter for closed fracture: Secondary | ICD-10-CM | POA: Diagnosis not present

## 2016-10-05 DIAGNOSIS — G309 Alzheimer's disease, unspecified: Secondary | ICD-10-CM | POA: Diagnosis not present

## 2016-10-06 DIAGNOSIS — G309 Alzheimer's disease, unspecified: Secondary | ICD-10-CM | POA: Diagnosis not present

## 2016-10-07 DIAGNOSIS — G309 Alzheimer's disease, unspecified: Secondary | ICD-10-CM | POA: Diagnosis not present

## 2016-10-08 DIAGNOSIS — G309 Alzheimer's disease, unspecified: Secondary | ICD-10-CM | POA: Diagnosis not present

## 2016-10-09 ENCOUNTER — Encounter: Payer: Self-pay | Admitting: Emergency Medicine

## 2016-10-09 ENCOUNTER — Emergency Department: Payer: Medicare HMO

## 2016-10-09 ENCOUNTER — Inpatient Hospital Stay
Admission: EM | Admit: 2016-10-09 | Discharge: 2016-10-13 | DRG: 871 | Disposition: A | Discharge status: Skilled Nursing Facility | Payer: Medicare HMO | Attending: Internal Medicine | Admitting: Internal Medicine

## 2016-10-09 DIAGNOSIS — M25551 Pain in right hip: Secondary | ICD-10-CM | POA: Diagnosis not present

## 2016-10-09 DIAGNOSIS — F419 Anxiety disorder, unspecified: Secondary | ICD-10-CM | POA: Diagnosis present

## 2016-10-09 DIAGNOSIS — Z8249 Family history of ischemic heart disease and other diseases of the circulatory system: Secondary | ICD-10-CM

## 2016-10-09 DIAGNOSIS — E876 Hypokalemia: Secondary | ICD-10-CM | POA: Diagnosis present

## 2016-10-09 DIAGNOSIS — Z8261 Family history of arthritis: Secondary | ICD-10-CM | POA: Diagnosis not present

## 2016-10-09 DIAGNOSIS — M810 Age-related osteoporosis without current pathological fracture: Secondary | ICD-10-CM | POA: Diagnosis not present

## 2016-10-09 DIAGNOSIS — E785 Hyperlipidemia, unspecified: Secondary | ICD-10-CM | POA: Diagnosis not present

## 2016-10-09 DIAGNOSIS — J440 Chronic obstructive pulmonary disease with acute lower respiratory infection: Secondary | ICD-10-CM | POA: Diagnosis present

## 2016-10-09 DIAGNOSIS — J9601 Acute respiratory failure with hypoxia: Secondary | ICD-10-CM | POA: Diagnosis present

## 2016-10-09 DIAGNOSIS — Z9911 Dependence on respirator [ventilator] status: Secondary | ICD-10-CM | POA: Diagnosis not present

## 2016-10-09 DIAGNOSIS — M4800 Spinal stenosis, site unspecified: Secondary | ICD-10-CM | POA: Diagnosis present

## 2016-10-09 DIAGNOSIS — R52 Pain, unspecified: Secondary | ICD-10-CM | POA: Diagnosis not present

## 2016-10-09 DIAGNOSIS — F0280 Dementia in other diseases classified elsewhere without behavioral disturbance: Secondary | ICD-10-CM | POA: Diagnosis present

## 2016-10-09 DIAGNOSIS — I10 Essential (primary) hypertension: Secondary | ICD-10-CM | POA: Diagnosis present

## 2016-10-09 DIAGNOSIS — J9690 Respiratory failure, unspecified, unspecified whether with hypoxia or hypercapnia: Secondary | ICD-10-CM | POA: Diagnosis not present

## 2016-10-09 DIAGNOSIS — Z66 Do not resuscitate: Secondary | ICD-10-CM | POA: Diagnosis present

## 2016-10-09 DIAGNOSIS — Z23 Encounter for immunization: Secondary | ICD-10-CM | POA: Diagnosis not present

## 2016-10-09 DIAGNOSIS — A419 Sepsis, unspecified organism: Principal | ICD-10-CM | POA: Diagnosis present

## 2016-10-09 DIAGNOSIS — J9621 Acute and chronic respiratory failure with hypoxia: Secondary | ICD-10-CM

## 2016-10-09 DIAGNOSIS — M4310 Spondylolisthesis, site unspecified: Secondary | ICD-10-CM | POA: Diagnosis present

## 2016-10-09 DIAGNOSIS — Z90710 Acquired absence of both cervix and uterus: Secondary | ICD-10-CM | POA: Diagnosis not present

## 2016-10-09 DIAGNOSIS — Z8673 Personal history of transient ischemic attack (TIA), and cerebral infarction without residual deficits: Secondary | ICD-10-CM

## 2016-10-09 DIAGNOSIS — E870 Hyperosmolality and hypernatremia: Secondary | ICD-10-CM | POA: Diagnosis not present

## 2016-10-09 DIAGNOSIS — Z8349 Family history of other endocrine, nutritional and metabolic diseases: Secondary | ICD-10-CM

## 2016-10-09 DIAGNOSIS — M1990 Unspecified osteoarthritis, unspecified site: Secondary | ICD-10-CM | POA: Diagnosis not present

## 2016-10-09 DIAGNOSIS — R531 Weakness: Secondary | ICD-10-CM | POA: Diagnosis not present

## 2016-10-09 DIAGNOSIS — F329 Major depressive disorder, single episode, unspecified: Secondary | ICD-10-CM | POA: Diagnosis present

## 2016-10-09 DIAGNOSIS — G309 Alzheimer's disease, unspecified: Secondary | ICD-10-CM | POA: Diagnosis present

## 2016-10-09 DIAGNOSIS — Z9049 Acquired absence of other specified parts of digestive tract: Secondary | ICD-10-CM

## 2016-10-09 DIAGNOSIS — R05 Cough: Secondary | ICD-10-CM | POA: Diagnosis not present

## 2016-10-09 DIAGNOSIS — M6281 Muscle weakness (generalized): Secondary | ICD-10-CM | POA: Diagnosis not present

## 2016-10-09 DIAGNOSIS — Z5189 Encounter for other specified aftercare: Secondary | ICD-10-CM | POA: Diagnosis not present

## 2016-10-09 DIAGNOSIS — R0602 Shortness of breath: Secondary | ICD-10-CM | POA: Diagnosis not present

## 2016-10-09 DIAGNOSIS — F0390 Unspecified dementia without behavioral disturbance: Secondary | ICD-10-CM | POA: Diagnosis not present

## 2016-10-09 DIAGNOSIS — Z87891 Personal history of nicotine dependence: Secondary | ICD-10-CM | POA: Diagnosis not present

## 2016-10-09 DIAGNOSIS — Y95 Nosocomial condition: Secondary | ICD-10-CM | POA: Diagnosis not present

## 2016-10-09 DIAGNOSIS — J189 Pneumonia, unspecified organism: Secondary | ICD-10-CM | POA: Diagnosis present

## 2016-10-09 DIAGNOSIS — J9620 Acute and chronic respiratory failure, unspecified whether with hypoxia or hypercapnia: Secondary | ICD-10-CM | POA: Diagnosis present

## 2016-10-09 LAB — CBC WITH DIFFERENTIAL/PLATELET
BASOS PCT: 0 %
Basophils Absolute: 0 10*3/uL (ref 0–0.1)
EOS ABS: 0 10*3/uL (ref 0–0.7)
EOS PCT: 0 %
HEMATOCRIT: 39.2 % (ref 35.0–47.0)
Hemoglobin: 13.4 g/dL (ref 12.0–16.0)
Lymphocytes Relative: 2 %
Lymphs Abs: 0.3 10*3/uL — ABNORMAL LOW (ref 1.0–3.6)
MCH: 31.5 pg (ref 26.0–34.0)
MCHC: 34.2 g/dL (ref 32.0–36.0)
MCV: 92 fL (ref 80.0–100.0)
MONO ABS: 1 10*3/uL — AB (ref 0.2–0.9)
MONOS PCT: 6 %
NEUTROS ABS: 14.8 10*3/uL — AB (ref 1.4–6.5)
Neutrophils Relative %: 92 %
PLATELETS: 374 10*3/uL (ref 150–440)
RBC: 4.26 MIL/uL (ref 3.80–5.20)
RDW: 13.5 % (ref 11.5–14.5)
WBC: 16 10*3/uL — ABNORMAL HIGH (ref 3.6–11.0)

## 2016-10-09 LAB — COMPREHENSIVE METABOLIC PANEL
ALBUMIN: 2.9 g/dL — AB (ref 3.5–5.0)
ALK PHOS: 92 U/L (ref 38–126)
ALT: 25 U/L (ref 14–54)
AST: 50 U/L — AB (ref 15–41)
Anion gap: 14 (ref 5–15)
BILIRUBIN TOTAL: 1 mg/dL (ref 0.3–1.2)
BUN: 41 mg/dL — AB (ref 6–20)
CALCIUM: 9.1 mg/dL (ref 8.9–10.3)
CO2: 29 mmol/L (ref 22–32)
CREATININE: 0.69 mg/dL (ref 0.44–1.00)
Chloride: 97 mmol/L — ABNORMAL LOW (ref 101–111)
GFR calc Af Amer: >60 mL/min (ref >60)
GFR calc non Af Amer: >60 mL/min (ref >60)
GLUCOSE: 163 mg/dL — AB (ref 65–99)
Potassium: 3.1 mmol/L — ABNORMAL LOW (ref 3.5–5.1)
SODIUM: 140 mmol/L (ref 135–145)
TOTAL PROTEIN: 7.1 g/dL (ref 6.5–8.1)

## 2016-10-09 LAB — TROPONIN I: TROPONIN I: 0.09 ng/mL — AB (ref <0.03)

## 2016-10-09 LAB — LACTIC ACID, PLASMA: Lactic Acid, Venous: 1.8 mmol/L (ref 0.5–1.9)

## 2016-10-09 MED ORDER — LORAZEPAM 0.5 MG PO TABS: 0.5000 mg | ORAL_TABLET | Freq: Every evening | ORAL | Status: DC | PRN

## 2016-10-09 MED ORDER — DEXTROSE 5 % IV SOLN: 2.0000 g | INTRAVENOUS | Status: DC

## 2016-10-09 MED ORDER — VANCOMYCIN HCL IN DEXTROSE 1-5 GM/200ML-% IV SOLN
1000.0000 mg | Freq: Once | INTRAVENOUS | Status: AC
  Administered 2016-10-09: 1000 mg via INTRAVENOUS
  Filled 2016-10-09: qty 200

## 2016-10-09 MED ORDER — ASPIRIN EC 81 MG PO TBEC
81.0000 mg | DELAYED_RELEASE_TABLET | Freq: Every day | ORAL | Status: DC
  Administered 2016-10-10 – 2016-10-13 (×4): 81 mg via ORAL
  Filled 2016-10-09 (×4): qty 1

## 2016-10-09 MED ORDER — DOCUSATE SODIUM 100 MG PO CAPS: 100.0000 mg | ORAL_CAPSULE | Freq: Two times a day (BID) | ORAL | Status: DC | PRN

## 2016-10-09 MED ORDER — CALCIUM CARBONATE-VITAMIN D 500-200 MG-UNIT PO TABS
1.0000 | ORAL_TABLET | Freq: Two times a day (BID) | ORAL | Status: DC
  Administered 2016-10-10 – 2016-10-13 (×7): 1 via ORAL
  Filled 2016-10-09 (×7): qty 1

## 2016-10-09 MED ORDER — ALPRAZOLAM 0.5 MG PO TABS
0.2500 mg | ORAL_TABLET | Freq: Three times a day (TID) | ORAL | Status: DC | PRN
Start: 2016-10-09 — End: 2016-10-13

## 2016-10-09 MED ORDER — DEXTROSE 5 % IV SOLN
2.0000 g | Freq: Once | INTRAVENOUS | Status: AC
  Administered 2016-10-09: 2 g via INTRAVENOUS
  Filled 2016-10-09: qty 2

## 2016-10-09 MED ORDER — DEXTROSE 5 % IV SOLN
250.0000 mg | INTRAVENOUS | Status: DC
  Administered 2016-10-10 – 2016-10-11 (×3): 250 mg via INTRAVENOUS
  Filled 2016-10-09 (×4): qty 250

## 2016-10-09 MED ORDER — HYDROCODONE-ACETAMINOPHEN 5-325 MG PO TABS
1.0000 | ORAL_TABLET | Freq: Three times a day (TID) | ORAL | Status: DC | PRN
  Administered 2016-10-10 – 2016-10-12 (×3): 1 via ORAL
  Filled 2016-10-09 (×3): qty 1

## 2016-10-09 MED ORDER — POTASSIUM CHLORIDE CRYS ER 20 MEQ PO TBCR: 20.0000 meq | EXTENDED_RELEASE_TABLET | Freq: Two times a day (BID) | ORAL | Status: DC

## 2016-10-09 MED ORDER — PNEUMOCOCCAL VAC POLYVALENT 25 MCG/0.5ML IJ INJ
0.5000 mL | INJECTION | INTRAMUSCULAR | Status: AC
  Administered 2016-10-11: 0.5 mL via INTRAMUSCULAR
  Filled 2016-10-09: qty 0.5

## 2016-10-09 MED ORDER — SACCHAROMYCES BOULARDII 250 MG PO CAPS
250.0000 mg | ORAL_CAPSULE | Freq: Every day | ORAL | Status: DC
  Administered 2016-10-10 – 2016-10-13 (×4): 250 mg via ORAL
  Filled 2016-10-09 (×4): qty 1

## 2016-10-09 MED ORDER — CALCIUM CARBONATE-VITAMIN D 600-400 MG-UNIT PO TABS: ORAL_TABLET | Freq: Two times a day (BID) | ORAL | Status: DC

## 2016-10-09 MED ORDER — HYDROCHLOROTHIAZIDE 25 MG PO TABS
12.5000 mg | ORAL_TABLET | Freq: Every day | ORAL | Status: DC
  Filled 2016-10-09: qty 1

## 2016-10-09 MED ORDER — VANCOMYCIN HCL IN DEXTROSE 750-5 MG/150ML-% IV SOLN: 750.0000 mg | INTRAVENOUS | Status: DC

## 2016-10-09 MED ORDER — ALBUTEROL SULFATE (2.5 MG/3ML) 0.083% IN NEBU: 3.0000 mL | INHALATION_SOLUTION | RESPIRATORY_TRACT | Status: DC

## 2016-10-09 MED ORDER — IPRATROPIUM-ALBUTEROL 0.5-2.5 (3) MG/3ML IN SOLN
3.0000 mL | RESPIRATORY_TRACT | Status: DC
  Administered 2016-10-09 – 2016-10-10 (×2): 3 mL via RESPIRATORY_TRACT
  Filled 2016-10-09 (×2): qty 3

## 2016-10-09 MED ORDER — SERTRALINE HCL 50 MG PO TABS
25.0000 mg | ORAL_TABLET | Freq: Every day | ORAL | Status: DC
  Administered 2016-10-10 – 2016-10-13 (×4): 25 mg via ORAL
  Filled 2016-10-09 (×4): qty 1

## 2016-10-09 MED ORDER — DEXTROSE 5 % IV SOLN
1.0000 g | INTRAVENOUS | Status: DC
  Administered 2016-10-10 – 2016-10-12 (×4): 1 g via INTRAVENOUS
  Filled 2016-10-09 (×4): qty 10

## 2016-10-09 MED ORDER — HEPARIN SODIUM (PORCINE) 5000 UNIT/ML IJ SOLN
5000.0000 [IU] | Freq: Three times a day (TID) | INTRAMUSCULAR | Status: DC
  Administered 2016-10-09 – 2016-10-13 (×11): 5000 [IU] via SUBCUTANEOUS
  Filled 2016-10-09 (×11): qty 1

## 2016-10-09 NOTE — H&P (Addendum)
[facility] [place] [facility]   PATIENT NAME: Allison Sullivan    MR#:  [id number]  DATE OF BIRTH:  Sep 30, 1930  DATE OF ADMISSION:  10/09/2016  PRIMARY CARE PHYSICIAN: Patrice Paradise, MD   REQUESTING/REFERRING PHYSICIAN: Paduchowski  CHIEF COMPLAINT:   Chief Complaint  Patient presents with  . Shortness of Breath    HISTORY OF PRESENT ILLNESS: Allison Sullivan  is a 83 y.o. female with a known history of COPD, degenerative joint disease, depression, hyperlipidemia, hypertension, stroke, thyroid disease- lives in a memory care unit. For last 3-4 days she has some cough and shortness of breath. Which is progressively getting worse and today the nursing home staff decided to send her to emergency room as her oxygen saturation was in 70s on room air. In ER she is noted to have bibasilar pneumonia and her white cell count was high and she was requiring supplemental oxygen so given to hospitalist team for further management. Dementia patient is not able to participate in history taking and this history is obtained from her son and daughter were present in the room.  PAST MEDICAL HISTORY:   Past Medical History:  Diagnosis Date  . Anxiety   . Arthritis   . COPD (chronic obstructive pulmonary disease) (HCC)    Documented  . Degenerative joint disease    degerative disc disease  . Dementia   . Depression   . Hyperlipidemia   . Hypertension   . Osteoporosis   . Spondylolisthesis    with foraminal stenosis  . Stroke [facility])    mini stroke-2 yrs ago  . Thyroid disease 1979   Thyroid lesion    PAST SURGICAL HISTORY: Past Surgical History:  Procedure Laterality Date  . ABDOMINAL HYSTERECTOMY  1997  . APPENDECTOMY  1979  . CHOLECYSTECTOMY  2002  . EXCISION OF ADNEXAL MASS Left   . EYE SURGERY Bilateral    Cataract  . FOOT SURGERY Right   . THYROID SURGERY     thyroid lesion resection  . TRIGGER FINGER RELEASE Left 09/11/2014   Procedure: RELEASE TRIGGER  FINGER/A-1 PULLEY;  Surgeon: Christena Flake, MD;  Location: [facility];  Service: Orthopedics;  Laterality: Left;  RISK FOR FALLS    SOCIAL HISTORY:  Social History  Substance Use Topics  . Smoking status: Former Smoker    Packs/day: 1.00    Years: 60.00    Types: Cigarettes  . Smokeless tobacco: Never Used     Comment: quit 04/14/2016  . Alcohol use No    FAMILY HISTORY:  Family History  Problem Relation Age of Onset  . Heart disease Mother 15       died of attack  . Arthritis Mother   . Hyperlipidemia Mother   . Heart disease Father 72       died of heart attack  . Prostate cancer Neg Hx   . Kidney cancer Neg Hx   . Bladder Cancer Neg Hx     DRUG ALLERGIES: No Known Allergies  REVIEW OF SYSTEMS:   Patient is dementia, not able to give a review of system.  MEDICATIONS AT HOME:  Prior to Admission medications   Medication Sig Start Date End Date Taking? Authorizing Provider  albuterol (PROVENTIL) (2.5 MG/3ML) 0.083% nebulizer solution Take 3 mLs by nebulization every 6 (six) hours as needed for wheezing. 04/29/16 04/29/17 Yes [provider]  ALPRAZolam (XANAX) 0.25 MG tablet Take 0.25 mg by mouth 3 (three) times daily as needed for  anxiety.   Yes [provider]  aspirin EC 81 MG tablet Take 81 mg by mouth daily.   Yes [provider]  Calcium Carbonate-Vitamin D (CALTRATE 600+D PO) Take 1 tablet by mouth 2 (two) times daily. sporatic   Yes [provider]  estradiol (ESTRACE VAGINAL) 0.1 MG/GM vaginal cream Apply 0.5mg  (pea-sized amount)  just inside the vaginal introitus with a finger-tip on  Monday, Wednesday and Friday nights. 05/31/16  Yes McGowan, Carollee Herter A, PA-C  hydrochlorothiazide (HYDRODIURIL) 12.5 MG tablet Take 12.5 mg by mouth daily.   Yes [provider]  HYDROcodone-acetaminophen (NORCO/VICODIN) 5-325 MG tablet Take 1 tablet by mouth every 8 (eight) hours as needed for moderate pain.   Yes [provider]  LORazepam (ATIVAN) 0.5 MG tablet Take 0.5 mg by mouth at bedtime as needed for anxiety.   Yes [provider]  saccharomyces boulardii (FLORASTOR) 250 MG capsule Take 250 mg by mouth daily.   Yes [provider]  sertraline (ZOLOFT) 25 MG tablet Take 25 mg by mouth daily. 05/13/16 05/13/17 Yes [provider]      PHYSICAL EXAMINATION:   VITAL SIGNS: Blood pressure 139/68, pulse 94, temperature 99 F (37.2 C), temperature source Axillary, resp. rate (!) 36, height 5\' 2"  (1.575 m), weight 43.1 kg (95 lb), SpO2 94 %.  GENERAL:  83 y.o.-year-old patient lying in the bed with no acute distress.  EYES: Pupils equal, round, reactive to light and accommodation. No scleral icterus. Extraocular muscles intact.  HEENT: Head atraumatic, normocephalic. Oropharynx and nasopharynx clear.  NECK:  Supple, no jugular venous distention. No thyroid enlargement, no tenderness.  LUNGS: Normal breath sounds bilaterally, no wheezing, Bilateral crepitation. No use of accessory muscles of respiration.  CARDIOVASCULAR: S1, S2 normal. No murmurs, rubs, or gallops.  ABDOMEN: Soft, nontender, nondistended. Bowel sounds present. No organomegaly or mass.  EXTREMITIES: No pedal edema, cyanosis, or clubbing.  NEUROLOGIC: Cranial nerves II through XII are intact. Muscle strength 4/5 in all extremities. Sensation intact. Gait not checked.  PSYCHIATRIC: The patient is alert and oriented x 1.  SKIN: No obvious rash, lesion, or ulcer.   LABORATORY PANEL:   CBC  Recent Labs Lab 10/09/16 1842  WBC 16.0*  HGB 13.4  HCT 39.2  PLT 374  MCV 92.0  MCH 31.5  MCHC 34.2  RDW 13.5  LYMPHSABS 0.3*  MONOABS 1.0*  EOSABS 0.0  BASOSABS 0.0   ------------------------------------------------------------------------------------------------------------------  Chemistries   Recent Labs Lab 10/09/16 1842  NA 140  K 3.1*  CL 97*  CO2 29  GLUCOSE 163*  BUN 41*  CREATININE 0.69   CALCIUM 9.1  AST 50*  ALT 25  ALKPHOS 92  BILITOT 1.0   ------------------------------------------------------------------------------------------------------------------ estimated creatinine clearance is 35.6 mL/min (by C-G formula based on SCr of 0.69 mg/dL). ------------------------------------------------------------------------------------------------------------------ No results for input(s): TSH, T4TOTAL, T3FREE, THYROIDAB in the last 72 hours.  Invalid input(s): FREET3   Coagulation profile No results for input(s): INR, PROTIME in the last 168 hours. ------------------------------------------------------------------------------------------------------------------- No results for input(s): DDIMER in the last 72 hours. -------------------------------------------------------------------------------------------------------------------  Cardiac Enzymes  Recent Labs Lab 10/09/16 1842  TROPONINI 0.09*   ------------------------------------------------------------------------------------------------------------------ Invalid input(s): POCBNP  ---------------------------------------------------------------------------------------------------------------  Urinalysis    Component Value Date/Time   COLORURINE YELLOW (A) 05/14/2016 0618   APPEARANCEUR Clear 07/13/2016 1627   LABSPEC 1.013 05/14/2016 0618   LABSPEC 1.012 10/10/2012 0015   PHURINE 7.0 05/14/2016 0618   GLUCOSEU Negative 07/13/2016 1627   GLUCOSEU Negative 10/10/2012 0015  HGBUR MODERATE (A) 05/14/2016 0618   BILIRUBINUR Negative 07/13/2016 1627   BILIRUBINUR Negative 10/10/2012 0015   KETONESUR NEGATIVE 05/14/2016 0618   PROTEINUR Negative 07/13/2016 1627   PROTEINUR NEGATIVE 05/14/2016 0618   NITRITE Negative 07/13/2016 1627   NITRITE NEGATIVE 05/14/2016 0618   LEUKOCYTESUR Negative 07/13/2016 1627   LEUKOCYTESUR Negative 10/10/2012 0015     RADIOLOGY: Dg Chest Port 1 View  Result Date:  10/09/2016 CLINICAL DATA:  Cough. EXAM: PORTABLE CHEST 1 VIEW COMPARISON:  May 14, 2016 FINDINGS: New bibasilar infiltrates. The cardiomediastinal silhouette is stable. No pneumothorax. No other interval changes. IMPRESSION: New bibasilar infiltrates worrisome for pneumonia or aspiration. No other change. Recommend follow-up to resolution. Electronically Signed   By: Gerome Samavid  Williams III M.D   On: 10/09/2016 18:56    EKG: Orders placed or performed during the hospital encounter of 10/09/16  . EKG 12-Lead  . EKG 12-Lead  . ED EKG 12-Lead  . ED EKG 12-Lead    IMPRESSION AND PLAN:  * Acute hypoxic respiratory failure secondary to pneumonia.   We'll continue supplemental oxygen and try to taper, check for oxygen requirement at the time of discharge.   IV ceftriaxone and azithromycin for pneumonia, cultures are sent.   We will also check for speech and swallow evaluation.  * Dementia   Continue sertraline.  * Hypokalemia   We'll replace orally and recheck tomorrow.  * recent fracture of forearm   Monitor.  All the records are reviewed and case discussed with ED provider. Management plans discussed with the patient, family and they are in agreement.  CODE STATUS:DO NOT RESUSCITATE  Code Status History    Date Active Date Inactive Code Status Order ID Comments User Context   10/08/2015  6:11 PM 10/10/2015  3:17 PM Full Code [id number]  Houston SirenSainani, Vivek J, MD Inpatient     Consulting with patient's son and daughter in the room.  TOTAL TIME TAKING CARE OF THIS PATIENT: 50  minutes.    Altamese DillingVACHHANI, Marcie Shearon M.D on 10/09/2016   Between 7am to 6pm - Pager - [phone]  After 6pm go to [email or web address] - password [email or web address]  [facility]  Office  [phone]  CC: Primary care physician; Patrice ParadiseMcLaughlin, Miriam K, MD   Note: This dictation was prepared with Dragon dictation along with smaller phrase technology. Any transcriptional errors that result from this process are  unintentional.

## 2016-10-09 NOTE — ED Notes (Signed)
Spoke with dr.V - hold the potassium pill until after speech and language does a swallow eval.

## 2016-10-09 NOTE — Progress Notes (Signed)
Pharmacy Antibiotic Note  Allison Sullivan is a 83 y.o. female admitted on 10/09/2016 with PNA.  Pharmacy has been consulted for cefepime and vancomycin dosing.  Plan: 1. Cefepime 2 gm IV Q24H 2. Vancomycin 1 gm IV x 1 in ED followed by vancomycin 750 mg IV Q24H, predicted trough 20 mcg/mL. Pharmacy will continue to follow and adjust as needed to maintain trough 15 to 20 mcg/mL.   Vd 30.2 L, Ke 0.034 hr-1, T1/2 20.4 hr  Height: 5\' 2"  (157.5 cm) Weight: 95 lb (43.1 kg) IBW/kg (Calculated) : 50.1  Temp (24hrs), Avg:99 F (37.2 C), Min:99 F (37.2 C), Max:99 F (37.2 C)   Recent Labs Lab 10/09/16 1842  WBC 16.0*  CREATININE 0.69  LATICACIDVEN 1.8    Estimated Creatinine Clearance: 35.6 mL/min (by C-G formula based on SCr of 0.69 mg/dL).    No Known Allergies  Thank you for allowing pharmacy to be a part of this patient's care.  Allison FrostNathan A Airik Sullivan, Pharm.D., BCPS Clinical Pharmacist 10/09/2016 7:51 PM

## 2016-10-09 NOTE — Progress Notes (Signed)
Family Meeting Note  Advance Directive:yes  Today a meeting took place with the Patient and son and daughter.  The following clinical team members were present during this meeting:MD  The following were discussed:Patient's diagnosis:Pneumonia, respi failure , Patient's progosis: Unable to determine and Goals for treatment: DNR  Additional follow-up to be provided: PMD  Time spent during discussion:20 minutes  Antoneo Ghrist, Heath GoldVAIBHAVKUMAR, MD

## 2016-10-09 NOTE — ED Provider Notes (Signed)
[facility] Emergency Department Provider Note  Time seen: 6:41 PM  I have reviewed the triage vital signs and the nursing notes.   HISTORY  Chief Complaint Shortness of Breath    HPI Allison Sullivan is a 83 y.o. female with a past medical history of COPD, dementia, hypertension, hyperlipidemia, presents to the emergency department for cough and shortness of breath. According to EMS report the patient does not wear oxygen at baseline for the past 1 week they've noted a cough today they noted patient to be hypoxic. EMS states on arrival the patient had a room air saturation of 70%. Family is here with the patient who verified the patient does not have a baseline O2 requirement. They've also noted a wet sounding cough for the past one week. Here the patient is awake alert, answers questions but family states due to her dementia she is not answering accurately but this is baseline.  Past Medical History:  Diagnosis Date  . Anxiety   . Arthritis   . COPD (chronic obstructive pulmonary disease) (HCC)    Documented  . Degenerative joint disease    degerative disc disease  . Dementia   . Depression   . Hyperlipidemia   . Hypertension   . Osteoporosis   . Spondylolisthesis    with foraminal stenosis  . Stroke [facility](HCC)    mini stroke-2 yrs ago  . Thyroid disease 1979   Thyroid lesion    Patient Active Problem List   Diagnosis Date Noted  . COPD (chronic obstructive pulmonary disease) (HCC) 06/14/2016  . Dementia 06/14/2016  . DJD (degenerative joint disease) 06/14/2016  . Hypertension 06/14/2016  . Osteoporosis 06/14/2016  . Tobacco abuse 06/14/2016  . Hip pain 10/08/2015  . Late onset Alzheimer's disease without behavioral disturbance 11/25/2014  . Trigger ring finger of left hand 08/23/2014    Past Surgical History:  Procedure Laterality Date  . ABDOMINAL HYSTERECTOMY  1997  . APPENDECTOMY  1979  . CHOLECYSTECTOMY  2002  . EXCISION OF ADNEXAL MASS  Left   . EYE SURGERY Bilateral    Cataract  . FOOT SURGERY Right   . THYROID SURGERY     thyroid lesion resection  . TRIGGER FINGER RELEASE Left 09/11/2014   Procedure: RELEASE TRIGGER FINGER/A-1 PULLEY;  Surgeon: Christena FlakeJohn J Poggi, MD;  [facility];  Service: Orthopedics;  Laterality: Left;  RISK FOR FALLS    Prior to Admission medications   Medication Sig Start Date End Date Taking? Authorizing Provider  albuterol (PROVENTIL) (2.5 MG/3ML) 0.083% nebulizer solution Take 3 mLs by nebulization every 6 (six) hours as needed for wheezing. 04/29/16 04/29/17  [provider]  aspirin EC 81 MG tablet Take 81 mg by mouth daily.    [provider]  Calcium Carbonate-Vitamin D (CALTRATE 600+D PO) Take 1 tablet by mouth 2 (two) times daily. sporatic    [provider]  clonazePAM (KLONOPIN) 0.5 MG tablet Take 0.5 mg by mouth as needed.  05/10/16   [provider]  cyanocobalamin (,VITAMIN B-12,) 1000 MCG/ML injection Inject 1,000 mcg into the muscle every 30 (thirty) days.  04/20/16   [provider]  doxycycline (VIBRAMYCIN) 100 MG capsule Take 1 capsule (100 mg total) by mouth every 12 (twelve) hours. Patient not taking: Reported on 07/13/2016 06/28/16   Michiel CowboyMcGowan, Shannon A, PA-C  erythromycin (E-MYCIN) 250 MG tablet Take 1 tablet (250 mg total) by mouth 4 (four) times daily. Patient not taking: Reported on 07/13/2016 06/18/16  Michiel Cowboy A, PA-C  estradiol (ESTRACE VAGINAL) 0.1 MG/GM vaginal cream Apply 0.5mg  (pea-sized amount)  just inside the vaginal introitus with a finger-tip on  Monday, Wednesday and Friday nights. 05/31/16   Michiel Cowboy A, PA-C  sertraline (ZOLOFT) 25 MG tablet Take 25 mg by mouth daily. 05/13/16 05/13/17  [provider]    No Known Allergies  Family History  Problem Relation Age of Onset  . Heart disease Mother 76       died of attack  . Arthritis Mother   . Hyperlipidemia Mother   . Heart disease Father  58       died of heart attack  . Prostate cancer Neg Hx   . Kidney cancer Neg Hx   . Bladder Cancer Neg Hx     Social History Social History  Substance Use Topics  . Smoking status: Former Smoker    Packs/day: 1.00    Years: 60.00    Types: Cigarettes  . Smokeless tobacco: Never Used     Comment: quit 04/14/2016  . Alcohol use No    Review of Systems Unable to obtain an adequate review of systems due to significant baseline dementia.  ____________________________________________   PHYSICAL EXAM:  VITAL SIGNS: ED Triage Vitals  Enc Vitals Group     BP 10/09/16 1813 (!) 155/56     Pulse Rate 10/09/16 1813 89     Resp 10/09/16 1813 (!) 30     Temp 10/09/16 1813 99 F (37.2 C)     Temp Source 10/09/16 1813 Axillary     SpO2 10/09/16 1813 96 %     Weight 10/09/16 1808 95 lb (43.1 kg)     Height 10/09/16 1808 5\' 2"  (1.575 m)     Head Circumference --      Peak Flow --      Pain Score --      Pain Loc --      Pain Edu? --      Excl. in GC? --     Constitutional:Alert, awake, no distress. Eyes: Normal exam ENT   Head: Normocephalic and atraumatic   Mouth/Throat: Somewhat dry mucous membranes Respiratory: Moderate tachypnea around 30 breaths per minute. Patient does have rhonchi most noted in the left lung fields Gastrointestinal: Soft and nontender. No distention. Musculoskeletal: Nontender with normal range of motion in all extremities. No lower extremity edema. Neurologic:  Normal speech and language. No gross focal neurologic deficits Skin:  Skin is warm, dry and intact.  Psychiatric: Mood and affect are normal. Speech and behavior are normal.   ____________________________________________    EKG  EKG reviewed and interpreted by myself shows sinus tachycardia at 94 bpm, somewhat widened QRS with a normal axis, large normal intervals with nonspecific ST changes without ST elevation.  ____________________________________________     RADIOLOGY  bilateral pneumonia  ____________________________________________   INITIAL IMPRESSION / ASSESSMENT AND PLAN / ED COURSE  Pertinent labs & imaging results that were available during my care of the patient were reviewed by me and considered in my medical decision making (see chart for details).  Patient presents emergent department from her nursing facility with increased respiratory effort and hypoxia. Patient is hypoxic in the emergency department in the 80s on room air, round 90-92 percent on 2 L via nasal cannula. Family is here who states the patient does not have a baseline O2 requirement. No wheeze auscultated with the patient does have rhonchi somewhat worse on the left with a wet sounding  cough during examination. Very suspicious for pneumonia. We will check labs including lactic acid and blood cultures. We'll obtain a chest x-ray and continue to closely monitor.  Patient meet sepsis criteria. Antibiotics are infusing. White blood cell count is elevated at 16,000. Troponin elevated 0.09. We will continue the Biaxin admit to the hospital for further treatment.  CRITICAL CARE Performed by: Minna Antis   Total critical care time: 30 minutes  Critical care time was exclusive of separately billable procedures and treating other patients.  Critical care was necessary to treat or prevent imminent or life-threatening deterioration.  Critical care was time spent personally by me on the following activities: development of treatment plan with patient and/or surrogate as well as nursing, discussions with consultants, evaluation of patient's response to treatment, examination of patient, obtaining history from patient or surrogate, ordering and performing treatments and interventions, ordering and review of laboratory studies, ordering and review of radiographic studies, pulse oximetry and re-evaluation of patient's  condition.   ____________________________________________   FINAL CLINICAL IMPRESSION(S) / ED DIAGNOSES  Healthcare associated pneumonia Sepsis   Minna Antis, MD 10/09/16 2002

## 2016-10-09 NOTE — ED Triage Notes (Signed)
Pt taken off oxygen for sp02 verification

## 2016-10-09 NOTE — ED Notes (Signed)
Pt's depends changed and redden sacrum noted and dr informed.

## 2016-10-09 NOTE — ED Triage Notes (Signed)
Ems called for sob - pt spo2 in the 70's - 97% on 10liter mask. Rhonchi, cough.

## 2016-10-09 NOTE — ED Notes (Signed)
CODE SEPSIS CALLED TO TAMMY AT [facility] 

## 2016-10-10 LAB — URINALYSIS, ROUTINE W REFLEX MICROSCOPIC
BILIRUBIN URINE: NEGATIVE
Glucose, UA: 50 mg/dL — AB
HGB URINE DIPSTICK: NEGATIVE
Ketones, ur: NEGATIVE mg/dL
LEUKOCYTES UA: NEGATIVE
NITRITE: NEGATIVE
PROTEIN: 30 mg/dL — AB
RBC / HPF: NONE SEEN RBC/hpf (ref 0–5)
SPECIFIC GRAVITY, URINE: 1.026 (ref 1.005–1.030)
pH: 5 (ref 5.0–8.0)

## 2016-10-10 LAB — POTASSIUM: Potassium: 3.4 mmol/L — ABNORMAL LOW (ref 3.5–5.1)

## 2016-10-10 LAB — CBC
HEMATOCRIT: 34.6 % — AB (ref 35.0–47.0)
Hemoglobin: 11.7 g/dL — ABNORMAL LOW (ref 12.0–16.0)
MCH: 31.2 pg (ref 26.0–34.0)
MCHC: 33.8 g/dL (ref 32.0–36.0)
MCV: 92.3 fL (ref 80.0–100.0)
Platelets: 320 10*3/uL (ref 150–440)
RBC: 3.75 MIL/uL — ABNORMAL LOW (ref 3.80–5.20)
RDW: 13.3 % (ref 11.5–14.5)
WBC: 12.8 10*3/uL — ABNORMAL HIGH (ref 3.6–11.0)

## 2016-10-10 LAB — BASIC METABOLIC PANEL
ANION GAP: 9 (ref 5–15)
BUN: 36 mg/dL — AB (ref 6–20)
CHLORIDE: 99 mmol/L — AB (ref 101–111)
CO2: 32 mmol/L (ref 22–32)
Calcium: 8.3 mg/dL — ABNORMAL LOW (ref 8.9–10.3)
Creatinine, Ser: 0.64 mg/dL (ref 0.44–1.00)
GFR calc Af Amer: >60 mL/min (ref >60)
GLUCOSE: 188 mg/dL — AB (ref 65–99)
POTASSIUM: 2.5 mmol/L — AB (ref 3.5–5.1)
Sodium: 140 mmol/L (ref 135–145)

## 2016-10-10 LAB — MAGNESIUM: Magnesium: 2 mg/dL (ref 1.7–2.4)

## 2016-10-10 LAB — MRSA PCR SCREENING: MRSA by PCR: POSITIVE — AB

## 2016-10-10 MED ORDER — IPRATROPIUM-ALBUTEROL 0.5-2.5 (3) MG/3ML IN SOLN: 3.0000 mL | RESPIRATORY_TRACT | Status: DC | PRN

## 2016-10-10 MED ORDER — CHLORHEXIDINE GLUCONATE CLOTH 2 % EX PADS
6.0000 | MEDICATED_PAD | Freq: Every day | CUTANEOUS | Status: DC
  Administered 2016-10-10 – 2016-10-13 (×4): 6 via TOPICAL

## 2016-10-10 MED ORDER — ORAL CARE MOUTH RINSE
15.0000 mL | Freq: Two times a day (BID) | OROMUCOSAL | Status: DC
  Administered 2016-10-10 – 2016-10-13 (×6): 15 mL via OROMUCOSAL

## 2016-10-10 MED ORDER — MUPIROCIN 2 % EX OINT
1.0000 "application " | TOPICAL_OINTMENT | Freq: Two times a day (BID) | CUTANEOUS | Status: DC
  Administered 2016-10-10 – 2016-10-13 (×9): 1 via NASAL
  Filled 2016-10-10: qty 22

## 2016-10-10 MED ORDER — POTASSIUM CHLORIDE CRYS ER 20 MEQ PO TBCR
20.0000 meq | EXTENDED_RELEASE_TABLET | Freq: Two times a day (BID) | ORAL | Status: DC
  Administered 2016-10-11 (×2): 20 meq via ORAL
  Filled 2016-10-10 (×2): qty 1

## 2016-10-10 MED ORDER — POTASSIUM CHLORIDE CRYS ER 20 MEQ PO TBCR
20.0000 meq | EXTENDED_RELEASE_TABLET | Freq: Once | ORAL | Status: AC
  Administered 2016-10-10: 18:00:00 20 meq via ORAL
  Filled 2016-10-10: qty 1

## 2016-10-10 MED ORDER — POTASSIUM CHLORIDE CRYS ER 20 MEQ PO TBCR
40.0000 meq | EXTENDED_RELEASE_TABLET | ORAL | Status: AC
  Administered 2016-10-10 (×2): 40 meq via ORAL
  Filled 2016-10-10 (×2): qty 2

## 2016-10-10 MED ORDER — POTASSIUM CHLORIDE 10 MEQ/100ML IV SOLN
10.0000 meq | INTRAVENOUS | Status: DC
  Administered 2016-10-10: 10 meq via INTRAVENOUS
  Filled 2016-10-10: qty 100

## 2016-10-10 MED ORDER — IPRATROPIUM-ALBUTEROL 0.5-2.5 (3) MG/3ML IN SOLN
3.0000 mL | Freq: Four times a day (QID) | RESPIRATORY_TRACT | Status: DC
  Administered 2016-10-10 (×3): 3 mL via RESPIRATORY_TRACT
  Filled 2016-10-10 (×3): qty 3

## 2016-10-10 NOTE — Progress Notes (Signed)
Pt admitted into room 117 with respiratory distress and possible aspiration pna. Pt is alert to self only, confused to time, place and situation. Pt and daughter oriented to room, call light, and safety precautions. Order for speech therapy consult ordered, pt NPO until speech evaluation completed. Pt's daughter, [name], is at bedside. Pt is from [facility]. Pt voiding incontinent, pure wick placed to assist with patient voiding incontinent and to obtain urine specimen.  Redness and MASD to perineum area, covered with pink foam dressing. IV antibiotics infused per order, continue to monitor. MRSA-PCR swabb per standing order. MRSA results are positive. Pt placed on contact isolation, HCG and bactrim ointment ordered per policy. Eccymosis noted to BL upper extremities. Pt voided incontinent once since arrival to unit, pure wick placed and awaiting results to send for urine specimen.

## 2016-10-10 NOTE — Clinical Social Work Note (Signed)
CSW received consult that patient was admitted from [facility]. CSW will assess when able.  Argentina PonderKaren Martha Bertine Schlottman, MSW, Theresia MajorsLCSWA [phone]

## 2016-10-10 NOTE — Progress Notes (Signed)
PT Cancellation Note  Patient Details Name: Allison Sullivan MRN: [id number] DOB: 01/22/1931   Cancelled Treatment:    Reason Eval/Treat Not Completed: Medical issues which prohibited therapy. After performing chart review, PT noted patient's potassium to be 2.5 with cut off being <2.8. Movement assessment being held at this time. Will check back later if time permits.   Neita CarpJulie Ann Rhen Kawecki, PT, DPT 10/10/2016, 10:00 AM

## 2016-10-10 NOTE — Plan of Care (Signed)
Problem: Safety: Goal: Ability to remain free from injury will improve Outcome: Progressing High fall risk, bed alarm activated.

## 2016-10-10 NOTE — Progress Notes (Addendum)
MEDICATION RELATED CONSULT NOTE - INITIAL   Pharmacy Consult for Electrolyte Indication: hypokalemia  No Known Allergies  Patient Measurements: Height: 5\' 2"  (157.5 cm) Weight: 87 lb 14.4 oz (39.9 kg) IBW/kg (Calculated) : 50.1   Vital Signs: Temp: 98.3 F (36.8 C) (07/08 0446) Temp Source: Oral (07/08 0446) BP: 131/51 (07/08 0446) Pulse Rate: 88 (07/08 0446) Intake/Output from previous day: 07/07 0701 - 07/08 0700 In: 175 [IV Piggyback:175] Out: 175 [Urine:175] Intake/Output from this shift: No intake/output data recorded.  Labs:  Recent Labs  10/09/16 1842 10/10/16 0342  WBC 16.0* 12.8*  HGB 13.4 11.7*  HCT 39.2 34.6*  PLT 374 320  CREATININE 0.69 0.64  ALBUMIN 2.9*  --   PROT 7.1  --   AST 50*  --   ALT 25  --   ALKPHOS 92  --   BILITOT 1.0  --    Estimated Creatinine Clearance: 33 mL/min (by C-G formula based on SCr of 0.64 mg/dL).  Assessment: 83 yo female with K of 2.5 today. Initially ordered KCl 10 mEq IV x4 runs. Only received 1 run as IV no longer usable. KCl 20 mEq PO BID order noted.   Goal of Therapy:  K 3.5-5.0 Mag 1.8-2.0  Plan:  KCl 40 mEq PO q4h x2 doses Change KCl 20 mEq PO BID order to start tomorrow Add on Mag level (= 2)  Recheck K level at 1600 Follow up AM labs  Pharmacy will continue to follow.  Crist FatWang, Hannah L 10/10/2016,10:17 AM

## 2016-10-10 NOTE — Progress Notes (Signed)
MEDICATION RELATED CONSULT NOTE - INITIAL   Pharmacy Consult for Electrolyte Indication: hypokalemia  No Known Allergies  Patient Measurements: Height: 5\' 2"  (157.5 cm) Weight: 87 lb 14.4 oz (39.9 kg) IBW/kg (Calculated) : 50.1   Vital Signs: Temp: 97.7 F (36.5 C) (07/08 1111) Temp Source: Oral (07/08 1111) BP: 117/49 (07/08 1111) Pulse Rate: 90 (07/08 1111) Intake/Output from previous day: 07/07 0701 - 07/08 0700 In: 175 [IV Piggyback:175] Out: 175 [Urine:175] Intake/Output from this shift: No intake/output data recorded.  Labs:  Recent Labs  10/09/16 1842 10/10/16 0342 10/10/16 0406  WBC 16.0* 12.8*  --   HGB 13.4 11.7*  --   HCT 39.2 34.6*  --   PLT 374 320  --   CREATININE 0.69 0.64  --   MG  --   --  2.0  ALBUMIN 2.9*  --   --   PROT 7.1  --   --   AST 50*  --   --   ALT 25  --   --   ALKPHOS 92  --   --   BILITOT 1.0  --   --    Estimated Creatinine Clearance: 33 mL/min (by C-G formula based on SCr of 0.64 mg/dL).  Assessment: 83 yo female with K of 2.5 today. Initially ordered KCl 10 mEq IV x4 runs. Only received 1 run as IV no longer usable. KCl 20 mEq PO BID order noted.   Goal of Therapy:  K 3.5-5.0 Mag 1.8-2.0  Plan:  KCl 40 mEq PO q4h x2 doses Change KCl 20 mEq PO BID order to start tomorrow Add on Mag level (= 2)  Recheck K level at 1600 Follow up AM labs  7/8 1704 K 3.4. Will give additional potassium chloride 20 mEq po x 1, continue scheduled potassium supplement, and recheck electrolytes with AM labs.   Pharmacy will continue to follow.  Carola FrostNathan A Hershal Eriksson, Pharm.D., BCPS Clinical Pharmacist 10/10/2016,5:46 PM

## 2016-10-10 NOTE — Clinical Social Work Note (Signed)
Clinical Social Work Assessment  Patient Details  Name: Allison Sullivan MRN: [id number] Date of Birth: 1930-08-31  Date of referral:  10/10/16               Reason for consult:  Facility Placement                Permission sought to share information with:  Facility Industrial/product designerContact Representative Permission granted to share information::  Yes, Verbal Permission Granted  Name::        Agency::     Relationship::     Contact Information:     Housing/Transportation Living arrangements for the past 2 months:  Assisted Living Facility Source of Information:  Facility Patient Interpreter Needed:  None Criminal Activity/Legal Involvement Pertinent to Current Situation/Hospitalization:  No - Comment as needed Significant Relationships:  Adult Children Lives with:  Facility Resident Do you feel safe going back to the place where you live?  Yes Need for family participation in patient care:  Yes (Comment)  Care giving concerns:  Patient admitted from [facility].   Social Worker assessment / plan:  CSW attempted to meet with family at bedside with no success. CSW attempted to contact family and reached the patient's daughter, Allison Sullivan. Allison Sullivan confirmed that the patient is from [facility] and would return there unless PT recommends STR. Allison Sullivan indicated that the preference is for a SNF with memory care abilities due to her mother's fall risk. The CSW explained the referral process and updated Allison Sullivan that PT had not made a determination at this time, but that if SNF is the recommendation, the CSW would make the referral and discuss further.   Employment status:  Retired Database administratornsurance information:  Managed Medicare PT Recommendations:  Not assessed at this time Information / Referral to community resources:     Patient/Family's Response to care:  Allison Sullivan thanked the CSW for assistance.  Patient/Family's Understanding of and Emotional Response to Diagnosis, Current Treatment, and Prognosis:  The family understands that the patient may require STR and are in agreement.  Emotional Assessment Appearance:   (Patient was sleeping) Attitude/Demeanor/Rapport:   (Patient was sleeping) Affect (typically observed):   (Patient was sleeping) Orientation:   (Patient was sleeping) Alcohol / Substance use:  Never Used Psych involvement (Current and /or in the community):  No (Comment)  Discharge Needs  Concerns to be addressed:  Care Coordination, Discharge Planning Concerns Readmission within the last 30 days:  No Current discharge risk:    Barriers to Discharge:  Continued Medical Work up   UAL CorporationKaren M Aliece Honold, LCSW 10/10/2016, 3:21 PM

## 2016-10-10 NOTE — Progress Notes (Signed)
[facility] [place] at [facility]   PATIENT NAME: Allison Sullivan    MR#:  [id number]  DATE OF BIRTH:  March 10, 1931  SUBJECTIVE:  Came in with increasing shortness of breath cough found to have hypoxia. Chest showed pneumonia. Patient has advanced dementia is pleasantly confused  REVIEW OF SYSTEMS:   Review of Systems  Unable to perform ROS: Dementia   Tolerating Diet: Pending swallow eval Tolerating PT: Pending  DRUG ALLERGIES:  No Known Allergies  VITALS:  Blood pressure (!) 131/51, pulse 88, temperature 98.3 F (36.8 C), temperature source Oral, resp. rate (!) 21, height 5\' 2"  (1.575 m), weight 39.9 kg (87 lb 14.4 oz), SpO2 92 %.  PHYSICAL EXAMINATION:   Physical Exam  GENERAL:  83 y.o.-year-old patient lying in the bed with no acute distress.  EYES: Pupils equal, round, reactive to light and accommodation. No scleral icterus. Extraocular muscles intact.  HEENT: Head atraumatic, normocephalic. Oropharynx and nasopharynx clear.  NECK:  Supple, no jugular venous distention. No thyroid enlargement, no tenderness.  LUNGS: Coarse breath sounds bilaterally, no wheezing, rales, rhonchi. No use of accessory muscles of respiration.  CARDIOVASCULAR: S1, S2 normal. No murmurs, rubs, or gallops.  ABDOMEN: Soft, nontender, nondistended. Bowel sounds present. No organomegaly or mass.  EXTREMITIES: No cyanosis, clubbing or edema b/l.    NEUROLOGIC: Moves all extremities well. Limited exam due to advanced dementia  PSYCHIATRIC:  patient is alert and awake.  SKIN: No obvious rash, lesion, or ulcer.   LABORATORY PANEL:  CBC  Recent Labs Lab 10/10/16 0342  WBC 12.8*  HGB 11.7*  HCT 34.6*  PLT 320    Chemistries   Recent Labs Lab 10/09/16 1842 10/10/16 0342 10/10/16 0406  NA 140 140  --   K 3.1* 2.5*  --   CL 97* 99*  --   CO2 29 32  --   GLUCOSE 163* 188*  --   BUN 41* 36*  --   CREATININE 0.69 0.64  --   CALCIUM 9.1 8.3*  --   MG  --   --   2.0  AST 50*  --   --   ALT 25  --   --   ALKPHOS 92  --   --   BILITOT 1.0  --   --    Cardiac Enzymes  Recent Labs Lab 10/09/16 1842  TROPONINI 0.09*   RADIOLOGY:  Dg Chest Port 1 View  Result Date: 10/09/2016 CLINICAL DATA:  Cough. EXAM: PORTABLE CHEST 1 VIEW COMPARISON:  May 14, 2016 FINDINGS: New bibasilar infiltrates. The cardiomediastinal silhouette is stable. No pneumothorax. No other interval changes. IMPRESSION: New bibasilar infiltrates worrisome for pneumonia or aspiration. No other change. Recommend follow-up to resolution. Electronically Signed   By: Gerome Sam III M.D   On: 10/09/2016 18:56   ASSESSMENT AND PLAN:  Iveth Heidemann  is a 83 y.o. female with a known history of COPD, degenerative joint disease, depression, hyperlipidemia, hypertension, stroke, thyroid disease- lives in a memory care unit. For last 3-4 days she has some cough and shortness of breath. Which is progressively getting worse and today the nursing home staff decided to send her to emergency room as her oxygen saturation was in 70s on room air.  * Acute hypoxic respiratory failure secondary to pneumonia.   We'll continue supplemental oxygen and try to taper, check for oxygen requirement at the time of discharge.   IV ceftriaxone and azithromycin for pneumonia -Blood cultures negative. -Pending speech eval.  *  Dementia, advanced   Continue sertraline.  * Hypokalemia   We'll replace orally and recheck tomorrow. -DC hydrocodone thiazide  * recent fracture of forearm   Monitor.   care management for discharge planning  Case discussed with Care Management/Social Worker. Management plans discussed with the family and they are in agreement.  CODE STATUS: DO NOT RESUSCITATE  DVT Prophylaxis: Lovenox  TOTAL TIME TAKING CARE OF THIS PATIENT: 25 minutes.  >50% time spent on counselling and coordination of care patient's son Mr. Gaye AlkenMelton  POSSIBLE D/C IN one to 2 DAYS, DEPENDING ON  CLINICAL CONDITION.  Note: This dictation was prepared with Dragon dictation along with smaller phrase technology. Any transcriptional errors that result from this process are unintentional.  Zackariah Vanderpol M.D on 10/10/2016 at 11:12 AM  Between 7am to 6pm - Pager - [phone]  After 6pm go to [email or web address] - Social research officer, governmentpassword [email or web address] [facility]  [facility]  Office  [phone]  CC: Primary care physician; Patrice ParadiseMcLaughlin, Miriam K, MD

## 2016-10-11 LAB — BASIC METABOLIC PANEL
Anion gap: 5 (ref 5–15)
BUN: 30 mg/dL — AB (ref 6–20)
CHLORIDE: 107 mmol/L (ref 101–111)
CO2: 32 mmol/L (ref 22–32)
CREATININE: 0.49 mg/dL (ref 0.44–1.00)
Calcium: 8.3 mg/dL — ABNORMAL LOW (ref 8.9–10.3)
GFR calc Af Amer: >60 mL/min (ref >60)
GFR calc non Af Amer: >60 mL/min (ref >60)
Glucose, Bld: 120 mg/dL — ABNORMAL HIGH (ref 65–99)
POTASSIUM: 4.1 mmol/L (ref 3.5–5.1)
Sodium: 144 mmol/L (ref 135–145)

## 2016-10-11 LAB — PHOSPHORUS: PHOSPHORUS: 1.8 mg/dL — AB (ref 2.5–4.6)

## 2016-10-11 LAB — MAGNESIUM: Magnesium: 2.2 mg/dL (ref 1.7–2.4)

## 2016-10-11 MED ORDER — ENSURE ENLIVE PO LIQD
237.0000 mL | Freq: Two times a day (BID) | ORAL | Status: DC
  Administered 2016-10-11: 237 mL via ORAL

## 2016-10-11 MED ORDER — ADULT MULTIVITAMIN W/MINERALS CH
1.0000 | ORAL_TABLET | Freq: Every day | ORAL | Status: DC
  Administered 2016-10-12 – 2016-10-13 (×2): 1 via ORAL
  Filled 2016-10-11 (×2): qty 1

## 2016-10-11 MED ORDER — GUAIFENESIN-DM 100-10 MG/5ML PO SYRP
5.0000 mL | ORAL_SOLUTION | ORAL | Status: DC | PRN
  Administered 2016-10-12 – 2016-10-13 (×2): 5 mL via ORAL
  Filled 2016-10-11 (×3): qty 5

## 2016-10-11 MED ORDER — IPRATROPIUM-ALBUTEROL 0.5-2.5 (3) MG/3ML IN SOLN
3.0000 mL | Freq: Three times a day (TID) | RESPIRATORY_TRACT | Status: DC
  Administered 2016-10-11 – 2016-10-13 (×7): 3 mL via RESPIRATORY_TRACT
  Filled 2016-10-11 (×8): qty 3

## 2016-10-11 NOTE — Evaluation (Signed)
Clinical/Bedside Swallow Evaluation Patient Details  Name: Allison Sullivan MRN: [id number] Date of Birth: [birth year]  Today's Date: 10/11/2016 Time: SLP Start Time (ACUTE ONLY): 1130 SLP Stop Time (ACUTE ONLY): 1230 SLP Time Calculation (min) (ACUTE ONLY): 60 min  Past Medical History:  Past Medical History:  Diagnosis Date  . Anxiety   . Arthritis   . COPD (chronic obstructive pulmonary disease) (HCC)    Documented  . Degenerative joint disease    degerative disc disease  . Dementia   . Depression   . Hyperlipidemia   . Hypertension   . Osteoporosis   . Spondylolisthesis    with foraminal stenosis  . Stroke [facility])    mini stroke-2 yrs ago  . Thyroid disease 1979   Thyroid lesion   Past Surgical History:  Past Surgical History:  Procedure Laterality Date  . ABDOMINAL HYSTERECTOMY  1997  . APPENDECTOMY  1979  . CHOLECYSTECTOMY  2002  . EXCISION OF ADNEXAL MASS Left   . EYE SURGERY Bilateral    Cataract  . FOOT SURGERY Right   . THYROID SURGERY     thyroid lesion resection  . TRIGGER FINGER RELEASE Left 09/11/2014   Procedure: RELEASE TRIGGER FINGER/A-1 PULLEY;  Surgeon: Christena Flake, MD;  Location: [facility];  Service: Orthopedics;  Laterality: Left;  RISK FOR FALLS   HPI:  Pt is a 83 y.o. female with a known history of Dementia, COPD, degenerative joint disease, depression, hyperlipidemia, hypertension, stroke, thyroid disease- lives in a memory care unit. For last 3-4 days she has some cough and shortness of breath which was progressively getting worse and the nursing home staff decided to send her to emergency room as her oxygen saturation was in 70s on room air. Upon admission, she was noted to have bibasilar pneumonia and her white cell count was high and she was requiring supplemental oxygen. Pt was unable to participate in giving history d/t Dementia. Pt appears to require moderate assistance w/ ADLs including eating/drinking; answered questions inconsistently;  soft spoken. Appeared weak; noted reduced weight.    Assessment / Plan / Recommendation Clinical Impression  Pt appears at min increased risk for aspiration secondary to her declined Cognitive and medical status' overall. pt does not exhibit gross oropharyngeal phase dyspahgia but does exhibit weakness and overall slower follow through w/ tasks of eating/drinking, and she requires increased support holding the cup for drinking. Pt appeared to swallow small trials of thin liquids via cup/straw w/ no immediate, overt s/s of aspiration noted. The same was noted w/ trials of puree. Pt does require rest breaks b/t any task d/t the exertion and fatigue - this will help lessen risk for aspiration/dysphagia. Pt would benefit from Pills given in Puree - crushed as able. Recommend feeding assistance at all meals; Dietiican f/u for supplements as indicated. ST services will be available for any further education if indicated while admitted; she appears at her baseline w/ swallowing as per report. NSG updated.  SLP Visit Diagnosis: Dysphagia, oropharyngeal phase (R13.12)    Aspiration Risk  Mild aspiration risk (but reduced following general aspiration precautions)    Diet Recommendation  Dysphagia level 1 (puree) w/ Thin liquids; aspiration precautions - monitor any straw use; support w/ feeding at meals to lessen exertion/fatigue. Dietician f/u.   Medication Administration: Crushed with puree (as able or in liquid form)    Other  Recommendations Recommended Consults:  (Dietician f/u) Oral Care Recommendations: Oral care BID;Staff/trained caregiver to provide oral care  Follow up Recommendations None (TBD)      Frequency and Duration            Prognosis Prognosis for Safe Diet Advancement: Fair Barriers to Reach Goals: Cognitive deficits;Severity of deficits (baseline deficits)      Swallow Study   General Date of Onset: 10/09/16 HPI: Pt is a 83 y.o. female with a known history of Dementia,  COPD, degenerative joint disease, depression, hyperlipidemia, hypertension, stroke, thyroid disease- lives in a memory care unit. For last 3-4 days she has some cough and shortness of breath which was progressively getting worse and the nursing home staff decided to send her to emergency room as her oxygen saturation was in 70s on room air. Upon admission, she was noted to have bibasilar pneumonia and her white cell count was high and she was requiring supplemental oxygen. Pt was unable to participate in giving history d/t Dementia. Pt appears to require moderate assistance w/ ADLs including eating/drinking; answered questions inconsistently; soft spoken. Appeared weak; noted reduced weight.  Type of Study: Bedside Swallow Evaluation Previous Swallow Assessment: none indicated by pt Diet Prior to this Study: Dysphagia 1 (puree);Thin liquids Temperature Spikes Noted: No (wbc 12.8) Respiratory Status: Nasal cannula (3 liters) History of Recent Intubation: No Behavior/Cognition: Alert;Cooperative;Pleasant mood;Confused;Distractible;Requires cueing (slower to respond overall) Oral Cavity Assessment: Dry (sticky) Oral Care Completed by SLP: Recent completion by staff Oral Cavity - Dentition: Adequate natural dentition (missing few) Vision: Functional for self-feeding Self-Feeding Abilities: Able to feed self;Needs assist;Needs set up;Total assist Patient Positioning: Upright in bed Baseline Vocal Quality: Low vocal intensity (whisper almost) Volitional Cough: Cognitively unable to elicit Volitional Swallow: Unable to elicit    Oral/Motor/Sensory Function Overall Oral Motor/Sensory Function: Within functional limits (fair)   Ice Chips Ice chips: Within functional limits Presentation: Spoon (fed; 3 trials)   Thin Liquid Thin Liquid: Within functional limits Presentation: Cup;Self Fed (assisted in holding cup; 5 trials) Other Comments: upon using the straw to drink, pt exhibited a shakiness and was  unable to coordinate a smooth sucking action to draw in liquid through the straw    Nectar Thick Nectar Thick Liquid: Not tested   Honey Thick Honey Thick Liquid: Not tested   Puree Puree: Within functional limits Presentation: Spoon (fed; 5 trials)   Solid   GO   Solid: Not tested Other Comments: on puree at baseline    Functional Assessment Tool Used: clinical judgement Functional Limitations: Swallowing Swallow Current Status ([id number]): At least 20 percent but less than 40 percent impaired, limited or restricted Swallow Goal Status [phone]([id number]): At least 20 percent but less than 40 percent impaired, limited or restricted Swallow Discharge Status [phone]([id number]): At least 20 percent but less than 40 percent impaired, limited or restricted    Jerilynn SomKatherine Axel Meas, MS, CCC-SLP Cong Hightower 10/11/2016,2:34 PM

## 2016-10-11 NOTE — Progress Notes (Signed)
PT is recommending SNF. Clinical Social Worker (CSW) contacted patient's daughter [name] to discuss D/C plan. Per daughter patient has not been walking since January 2018 and has not been able to stand up for the past 1-2 months. CSW explained that patient is at her baseline and her [facility] will likely deny SNF for rehab. CSW discussed long term care SNF placement under medicaid. Daughter is agreeable to bed search.   Daughter toured [facility] and accepted bed offer from the facility. [facility] admissions coordinator at [facility] patient will have to give her social security check to [facility] in order to come under medicaid. Per Verlon AuLeslie she spoke with patient's daughter [name] and is meeting [name] tomorrow morning around 10:30 to go over the financial details. PASARR is pending. CSW will continue to follow and assist as needed.   B[employer] Hughes IncorporatedBailey Jullisa Grigoryan, LCSW [phone]

## 2016-10-11 NOTE — Progress Notes (Signed)
MEDICATION RELATED CONSULT NOTE   Pharmacy Consult for Electrolyte Indication: hypokalemia  No Known Allergies  Patient Measurements: Height: 5\' 2"  (157.5 cm) Weight: 87 lb 14.4 oz (39.9 kg) IBW/kg (Calculated) : 50.1   Vital Signs: Temp: 98 F (36.7 C) (07/09 0445) Temp Source: Oral (07/09 0445) BP: 125/59 (07/09 0446) Pulse Rate: 85 (07/09 0446) Intake/Output from previous day: 07/08 0701 - 07/09 0700 In: 415 [P.O.:240; IV Piggyback:175] Out: -  Intake/Output from this shift: No intake/output data recorded.  Labs:  Recent Labs  10/09/16 1842 10/10/16 0342 10/10/16 0406 10/11/16 0544  WBC 16.0* 12.8*  --   --   HGB 13.4 11.7*  --   --   HCT 39.2 34.6*  --   --   PLT 374 320  --   --   CREATININE 0.69 0.64  --  0.49  MG  --   --  2.0 2.2  PHOS  --   --   --  1.8*  ALBUMIN 2.9*  --   --   --   PROT 7.1  --   --   --   AST 50*  --   --   --   ALT 25  --   --   --   ALKPHOS 92  --   --   --   BILITOT 1.0  --   --   --    Estimated Creatinine Clearance: 33 mL/min (by C-G formula based on SCr of 0.49 mg/dL).  Assessment: 83 yo female with K of 2.5 initially. Initially ordered KCl 10 mEq IV x4 runs. Only received 1 run as IV no longer usable. Scheduled KCl 20 mEq PO BID order noted.   7/9 AM K=4.1  Goal of Therapy:  K 3.5-5.0 Mag 1.8-2.0  Plan:  Will continue with scheduled KCl supplementation, no additional orders. Will follow up on AM labs.  Pharmacy will continue to follow.  Clovia CuffLisa Casie Sturgeon, PharmD, BCPS 10/11/2016 10:41 AM

## 2016-10-11 NOTE — Progress Notes (Signed)
[facility] [place] [facility]   PATIENT NAME: Allison Sullivan    MR#:  [id number]  DATE OF BIRTH:  [birth year]  SUBJECTIVE:  Came in with increasing shortness of breath cough found to have hypoxia. Chest showed pneumonia. Patient has advanced dementia is pleasantly confused  REVIEW OF SYSTEMS:   Review of Systems  Unable to perform ROS: Dementia   Tolerating Diet: Pending swallow eval Tolerating PT: Pending  DRUG ALLERGIES:  No Known Allergies  VITALS:  Blood pressure (!) 99/40, pulse 78, temperature 97.8 F (36.6 C), temperature source Oral, resp. rate 18, height 5\' 2"  (1.575 m), weight 39.9 kg (87 lb 14.4 oz), SpO2 95 %.  PHYSICAL EXAMINATION:   Physical Exam  GENERAL:  83 y.o.-year-old patient lying in the bed with no acute distress.  EYES: Pupils equal, round, reactive to light and accommodation. No scleral icterus. Extraocular muscles intact.  HEENT: Head atraumatic, normocephalic. Oropharynx and nasopharynx clear.  NECK:  Supple, no jugular venous distention. No thyroid enlargement, no tenderness.  LUNGS: Coarse breath sounds bilaterally, no wheezing, rales, rhonchi. No use of accessory muscles of respiration.  CARDIOVASCULAR: S1, S2 normal. No murmurs, rubs, or gallops.  ABDOMEN: Soft, nontender, nondistended. Bowel sounds present. No organomegaly or mass.  EXTREMITIES: No cyanosis, clubbing or edema b/l.    NEUROLOGIC: Moves all extremities well. Limited exam due to advanced dementia  PSYCHIATRIC:  patient is alert and awake.  SKIN: No obvious rash, lesion, or ulcer.   LABORATORY PANEL:  CBC  Recent Labs Lab 10/10/16 0342  WBC 12.8*  HGB 11.7*  HCT 34.6*  PLT 320    Chemistries   Recent Labs Lab 10/09/16 1842  10/11/16 0544  NA 140  < > 144  K 3.1*  < > 4.1  CL 97*  < > 107  CO2 29  < > 32  GLUCOSE 163*  < > 120*  BUN 41*  < > 30*  CREATININE 0.69  < > 0.49  CALCIUM 9.1  < > 8.3*  MG  --   < > 2.2  AST 50*  --   --    ALT 25  --   --   ALKPHOS 92  --   --   BILITOT 1.0  --   --   < > = values in this interval not displayed. Cardiac Enzymes  Recent Labs Lab 10/09/16 1842  TROPONINI 0.09*   RADIOLOGY:  Dg Chest Port 1 View  Result Date: 10/09/2016 CLINICAL DATA:  Cough. EXAM: PORTABLE CHEST 1 VIEW COMPARISON:  May 14, 2016 FINDINGS: New bibasilar infiltrates. The cardiomediastinal silhouette is stable. No pneumothorax. No other interval changes. IMPRESSION: New bibasilar infiltrates worrisome for pneumonia or aspiration. No other change. Recommend follow-up to resolution. Electronically Signed   By: Gerome Sam III M.D   On: 10/09/2016 18:56   ASSESSMENT AND PLAN:  Idaliz Tinkle  is a 83 y.o. female with a known history of COPD, degenerative joint disease, depression, hyperlipidemia, hypertension, stroke, thyroid disease- lives in a memory care unit. For last 3-4 days she has some cough and shortness of breath. Which is progressively getting worse and today the nursing home staff decided to send her to emergency room as her oxygen saturation was in 70s on room air.  * Acute hypoxic respiratory failure secondary to pneumonia.   We'll continue supplemental oxygen and try to taper, check for oxygen requirement at the time of discharge.   IV ceftriaxone and azithromycin for pneumonia -  Blood cultures negative. -cont Pureed diet  *sepsis due to Pneumonia As above  * Dementia, advanced   Continue sertraline.  * Hypokalemia   We'll replace orally and recheck tomorrow. -DC hydrochlorthiazide  * recent fracture of forearm  *PT recommends SNF [facility].   Social worker for discharge planning  Case discussed with Care Management/Social Worker. Management plans discussed with the family and they are in agreement.  CODE STATUS: DO NOT RESUSCITATE  DVT Prophylaxis: Lovenox  TOTAL TIME TAKING CARE OF THIS PATIENT: 25 minutes.  >50% time spent on counselling and  coordination of care patient's son Mr. Gaye AlkenMelton  POSSIBLE D/C IN one to 2 DAYS, DEPENDING ON CLINICAL CONDITION.  Note: This dictation was prepared with Dragon dictation along with smaller phrase technology. Any transcriptional errors that result from this process are unintentional.  Ambri Miltner M.D on 10/11/2016 at 12:11 PM  Between 7am to 6pm - Pager - [phone]  After 6pm go to [email or web address] - Social research officer, governmentpassword EPAS [facility]  [facility]  Office  [phone]  CC: Primary care physician; Patrice ParadiseMcLaughlin, Miriam K, MD

## 2016-10-11 NOTE — Clinical Social Work Placement (Signed)
   CLINICAL SOCIAL WORK PLACEMENT  NOTE  Date:  10/11/2016  Patient Details  Name: Allison Sullivan MRN: [id number] Date of Birth: 1930-12-19  Clinical Social Work is seeking post-discharge placement for this patient at the Skilled  Nursing Facility level of care (*CSW will initial, date and re-position this form in  chart as items are completed):  Yes   Patient/family provided with [facility] Clinical Social Work Department's list of facilities offering this level of care within the geographic area requested by the patient (or if unable, by the patient's family).  Yes   Patient/family informed of their freedom to choose among providers that offer the needed level of care, that participate in Medicare, Medicaid or managed care program needed by the patient, have an available bed and are willing to accept the patient.  Yes   Patient/family informed of [facility]'s ownership interest in [facility] and [facility], as well as of the fact that they are under no obligation to receive care at these facilities.  PASRR submitted to EDS on 10/11/16     PASRR number received on       Existing PASRR number confirmed on       FL2 transmitted to all facilities in geographic area requested by pt/family on 10/11/16     FL2 transmitted to all facilities within larger geographic area on       Patient informed that his/her managed care company has contracts with or will negotiate with certain facilities, including the following:        Yes   Patient/family informed of bed offers received.  Patient [facility] bed at  [facility]     Physician recommends and patient chooses bed at      Patient to be transferred to   on  .  Patient to be transferred to facility by       Patient family notified on   of transfer.  Name of family member notified:        PHYSICIAN       Additional Comment:    _______________________________________________ Simren Popson, Darleen CrockerBailey M, LCSW 10/11/2016,  4:34 PM

## 2016-10-11 NOTE — [place] FL2 (Signed)
[place] MEDICAID FL2 LEVEL OF CARE SCREENING TOOL     IDENTIFICATION  Patient Name: Allison Sullivan Birthdate: 1930-10-11 Sex: female Admission Date (Current Location): 10/09/2016  [facility] and [place] Number:  [address]  ([id number]) Facility and Address:  [facility], [address], [place], [place] [address]      Provider Number: [id number]  Attending Physician Name and Address:  Enedina Finner, MD  Relative Name and Phone Number:       Current Level of Care: Hospital Recommended Level of Care: Skilled Nursing Facility Prior Approval Number:    Date Approved/Denied:   PASRR Number:   Discharge Plan: SNF    Current Diagnoses: Patient Active Problem List   Diagnosis Date Noted  . Acute on chronic respiratory failure with hypoxia (HCC) 10/09/2016  . Community acquired pneumonia 10/09/2016  . Acute on chronic respiratory failure (HCC) 10/09/2016  . COPD (chronic obstructive pulmonary disease) (HCC) 06/14/2016  . Dementia 06/14/2016  . DJD (degenerative joint disease) 06/14/2016  . Hypertension 06/14/2016  . Osteoporosis 06/14/2016  . Tobacco abuse 06/14/2016  . Hip pain 10/08/2015  . Late onset Alzheimer's disease without behavioral disturbance 11/25/2014  . Trigger ring finger of left hand 08/23/2014    Orientation RESPIRATION BLADDER Height & Weight     Self  O2 (3 Liters Oxygen ) Incontinent Weight: 87 lb 14.4 oz (39.9 kg) Height:  5\' 2"  (157.5 cm)  BEHAVIORAL SYMPTOMS/MOOD NEUROLOGICAL BOWEL NUTRITION STATUS      Continent Diet (Diet: DYS 1 )  AMBULATORY STATUS COMMUNICATION OF NEEDS Skin   Extensive Assist Verbally Normal PU Stage 1 Dressing:  (PRN perianal)                     Personal Care Assistance Level of Assistance  Bathing, Feeding, Dressing Bathing Assistance: Limited assistance Feeding assistance: Limited assistance Dressing Assistance: Limited assistance     Functional Limitations Info  Sight, Hearing, Speech  Sight Info: Adequate Hearing Info: Adequate Speech Info: Adequate    SPECIAL CARE FACTORS FREQUENCY  PT (By licensed PT), OT (By licensed OT)     PT Frequency:  (5) OT Frequency:  (5)            Contractures      Additional Factors Info  Code Status, Allergies, Isolation Precautions Code Status Info:  (DNR ) Allergies Info:  (No Known Allergies. )     Isolation Precautions Info:  (MRSA Nasal Swab )     Current Medications (10/11/2016):  This is the current hospital active medication list Current Facility-Administered Medications  Medication Dose Route Frequency Provider Last Rate Last Dose  . ALPRAZolam Prudy Feeler) tablet 0.25 mg  0.25 mg Oral TID PRN Altamese Dilling, MD      . aspirin EC tablet 81 mg  81 mg Oral Daily Altamese Dilling, MD   81 mg at 10/11/16 0941  . azithromycin (ZITHROMAX) 250 mg in dextrose 5 % 125 mL IVPB  250 mg Intravenous Q24H Altamese Dilling, MD   Stopped at 10/10/16 2300  . calcium-vitamin D (OSCAL WITH D) 500-200 MG-UNIT per tablet 1 tablet  1 tablet Oral BID Altamese Dilling, MD   1 tablet at 10/11/16 0941  . cefTRIAXone (ROCEPHIN) 1 g in dextrose 5 % 50 mL IVPB  1 g Intravenous Q24H Altamese Dilling, MD   Stopped at 10/10/16 2158  . Chlorhexidine Gluconate Cloth 2 % PADS 6 each  6 each Topical Q0600 Hugelmeyer, Alexis, DO   6 each at 10/11/16  [id number]  . docusate sodium (COLACE) capsule 100 mg  100 mg Oral BID PRN Altamese DillingVachhani, Vaibhavkumar, MD      . guaiFENesin-dextromethorphan (ROBITUSSIN DM) 100-10 MG/5ML syrup 5 mL  5 mL Oral Q4H PRN Enedina FinnerPatel, Sona, MD      . heparin injection 5,000 Units  5,000 Units Subcutaneous Q8H Altamese DillingVachhani, Vaibhavkumar, MD   5,000 Units at 10/11/16 [phone]  . HYDROcodone-acetaminophen (NORCO/VICODIN) 5-325 MG per tablet 1 tablet  1 tablet Oral Q8H PRN Altamese DillingVachhani, Vaibhavkumar, MD   1 tablet at 10/11/16 0941  . ipratropium-albuterol (DUONEB) 0.5-2.5 (3) MG/3ML nebulizer solution 3 mL  3 mL Nebulization Q4H PRN  Altamese DillingVachhani, Vaibhavkumar, MD      . ipratropium-albuterol (DUONEB) 0.5-2.5 (3) MG/3ML nebulizer solution 3 mL  3 mL Nebulization TID Enedina FinnerPatel, Sona, MD   3 mL at 10/11/16 0818  . LORazepam (ATIVAN) tablet 0.5 mg  0.5 mg Oral QHS PRN Altamese DillingVachhani, Vaibhavkumar, MD      . MEDLINE mouth rinse  15 mL Mouth Rinse BID Enedina FinnerPatel, Sona, MD   15 mL at 10/11/16 0948  . mupirocin ointment (BACTROBAN) 2 % 1 application  1 application Nasal BID Hugelmeyer, Alexis, DO   1 application at 10/11/16 0948  . potassium chloride SA (K-DUR,KLOR-CON) CR tablet 20 mEq  20 mEq Oral BID Enedina FinnerPatel, Sona, MD   20 mEq at 10/11/16 0941  . saccharomyces boulardii (FLORASTOR) capsule 250 mg  250 mg Oral Daily Altamese DillingVachhani, Vaibhavkumar, MD   250 mg at 10/11/16 0941  . sertraline (ZOLOFT) tablet 25 mg  25 mg Oral Daily Altamese DillingVachhani, Vaibhavkumar, MD   25 mg at 10/11/16 0941     Discharge Medications: Please see discharge summary for a list of discharge medications.  Relevant Imaging Results:  Relevant Lab Results:   Additional Information  (SSN: [id number])  Gilliam Hawkes, Darleen CrockerBailey M, LCSW

## 2016-10-11 NOTE — Evaluation (Signed)
Physical Therapy Evaluation Patient Details Name: Allison Sullivan MRN: [id number] DOB: July 06, 1930 Today's Date: 10/11/2016   History of Present Illness  Pt is a pleasant 83 yo F, admitted to acute care with acute systoloic heart failure due to valvular disease. PMH includes: COPD, deginerative joint disease, depression, HLD< HTN, stroke, thyroid disease, anxiety, dementia, OP, and spondylolisthesis.   Clinical Impression  Pt poor historian, with advanced dementia. Noted R forearm fracture under observation with chart review; discussed with nursing, who verified there are no WB restrictions, but pt does required R wrist/forearm brace. Pt performs bed mobility with MaxA for supine to sit and ModA for sit to supine due to impaired strength and motor processing. Tranfers performed with MaxA +2 due to impaired strength, endurance, sequencing, and motor processing. Pt with tendency to stand with flexed posture and unable to achieve erect standing with max cues; required ModA and max cues to bring hands from bed to RW with STS. Ambulation not attempted due to safety concerns. Pt on 3L supplemental O2 throughout duration of treatment, maintaining saturation between 89-92%; pt at 91% at end of session. Pt presents with the following deficits: strength, coordination, balance, endurance, sequencing, and motor processing. Overall, pt responded well to today's treatment with no adverse affects. Pt would benefit from skilled PT to address the previously mentioned impairments and promote return to PLOF. Currently recommending SNF, pending d/c.     Follow Up Recommendations SNF    Equipment Recommendations  None recommended by PT    Recommendations for Other Services       Precautions / Restrictions Precautions Precautions: Fall Precaution Comments: Pt with advanced dementia.  Required Braces or Orthoses: Other Brace/Splint (R wrist brace) Restrictions Weight Bearing Restrictions: No      Mobility  Bed  Mobility Overal bed mobility: Needs Assistance Bed Mobility: Supine to Sit;Sit to Supine     Supine to sit: Max assist Sit to supine: Mod assist   General bed mobility comments: Pt with difficulty following commands; requires increased time and continuous cues to acheive position. Pt became frightened when SPT assisted in scooting her to EOB; continuous communication is vital to her success.   Transfers Overall transfer level: Needs assistance   Transfers: Sit to/from Stand Sit to Stand: Max assist;+2 physical assistance         General transfer comment: MaxA +2 for physical assistance; RW for standing UE support. Impaired sequencing and motor processing noted as pt has tendency to come half way to stand and then pause. Pt unable to come to full erect stand with x3 attempts. Max verbal/tactile cues to transition hands from bed to RW.   Ambulation/Gait             General Gait Details: Not attempted due to safety concerns.   Stairs            Wheelchair Mobility    Modified Rankin (Stroke Patients Only)       Balance Overall balance assessment: Needs assistance Sitting-balance support: Feet supported;Bilateral upper extremity supported Sitting balance-Leahy Scale: Fair Sitting balance - Comments: Requires CGA-minA with seated balance, as pt is impulsive and with impaired strength. Pt with tendency to lean posteriorly.   Standing balance support: Bilateral upper extremity supported Standing balance-Leahy Scale: Poor Standing balance comment: poor standing balance, requiring maxA +2. Pt with tendency to flex at hips, with difficulty coming to erect standing posture.  Pertinent Vitals/Pain Pain Assessment: No/denies pain    Home Living Family/patient expects to be discharged to:: Other (Comment)                 Additional Comments: Prior to admission pt in [facility]; unclear of prior level of function, as  pt was poor historian and no family was present.    Prior Function Level of Independence: Needs assistance         Comments: Pt is poor historian, due to advanced dementia.      Hand Dominance        Extremity/Trunk Assessment   Upper Extremity Assessment Upper Extremity Assessment: Generalized weakness (MMT to B UE's grossly 4-/5)    Lower Extremity Assessment Lower Extremity Assessment: Generalized weakness (MMT to B LE's grossly 4-/5)       Communication   Communication: Other (comment) (Quiet; whispers when speaks.)  Cognition Arousal/Alertness: Awake/alert Behavior During Therapy: Flat affect Overall Cognitive Status: History of cognitive impairments - at baseline Area of Impairment: Memory;Problem solving;Safety/judgement;Following commands;Attention                       Following Commands: Follows one step commands inconsistently;Follows one step commands with increased time Safety/Judgement: Decreased awareness of safety     General Comments: Advanced dementia. Able to follow 1 step commands ~50% of the time, but requires increased time and tactile cues.        General Comments      Exercises Other Exercises Other Exercises: Supine therex performed to B LE's/UE's with minA and max verbal cues x5 reps: SLR, hip abd, and shoulder flex. Increased time required to perform all therex, as pt presented with poor sequencing and attention to task. Pt performed 3x STS with max A +2 and max verbal cues. O2 sat dropped to 89% on 3L supplemental O2, but pt recovered quickly to 91% with seated rest.    Assessment/Plan    PT Assessment Patient needs continued PT services  PT Problem List Decreased strength;Decreased activity tolerance;Decreased balance;Decreased mobility;Decreased coordination;Decreased cognition;Decreased safety awareness;Decreased knowledge of use of DME;Decreased knowledge of precautions;Cardiopulmonary status limiting activity       PT  Treatment Interventions DME instruction;Gait training;Functional mobility training;Therapeutic activities;Therapeutic exercise;Balance training;Neuromuscular re-education;Patient/family education    PT Goals (Current goals can be found in the Care Plan section)  Acute Rehab PT Goals Patient Stated Goal: to sit up  PT Goal Formulation: With patient Time For Goal Achievement: 10/25/16 Potential to Achieve Goals: Fair    Frequency Min 2X/week   Barriers to discharge        Co-evaluation               AM-PAC PT "6 Clicks" Daily Activity  Outcome Measure Difficulty turning over in bed (including adjusting bedclothes, sheets and blankets)?: Total Difficulty moving from lying on back to sitting on the side of the bed? : Total Difficulty sitting down on and standing up from a chair with arms (e.g., wheelchair, bedside commode, etc,.)?: Total Help needed moving to and from a bed to chair (including a wheelchair)?: Total Help needed walking in hospital room?: Total Help needed climbing 3-5 steps with a railing? : Total 6 Click Score: 6    End of Session   Activity Tolerance: Other (comment) (Limited by ability to follow commands. ) Patient left: in bed;with call bell/phone within reach;with bed alarm set Nurse Communication: Mobility status PT Visit Diagnosis: Other abnormalities of gait and mobility (R26.89);Muscle weakness (generalized) (  M62.81);Unsteadiness on feet (R26.81)    Time: 1610-9604 PT Time Calculation (min) (ACUTE ONLY): 52 min   Charges:         PT G Codes:        Sharman Cheek PT, SPT   Latanya Maudlin 10/11/2016, 11:06 AM

## 2016-10-11 NOTE — Progress Notes (Signed)
Initial Nutrition Assessment  DOCUMENTATION CODES:   Severe malnutrition in context of chronic illness, Underweight  INTERVENTION:  Provide Ensure Enlive po BID, each supplement provides 350 kcal and 20 grams of protein.   Provide multivitamin with minerals daily.  NUTRITION DIAGNOSIS:   Malnutrition (Severe) related to chronic illness (advanced age, dementia) as evidenced by severe depletion of body fat, severe depletion of muscle mass.  GOAL:   Patient will meet greater than or equal to 90% of their needs   MONITOR:   PO intake, Supplement acceptance, Labs, Weight trends, I & O's  REASON FOR ASSESSMENT:   Malnutrition Screening Tool    ASSESSMENT:   83 year old female with PMHx of HTN, HLD, degenerative joint disease, osteoporosis, spondylolisthesis, thyroid disease, hx of CVA, COPD, advanced dementia, anxiety, depression who presented from [facility] with shortness of breath and cough found to have PNA and sepsis.   Patient unable to provide history in setting of advanced dementia. Discussed with NT who reports patient has been hungry and eating fairly well at meals. She has enjoyed applesauce and ice cream here. She ate about 90% of her breakfast this morning which was cream of wheat, grits, and eggs. She ate about 60% of one of her meals yesterday, which was pureed mashed potatoes and chicken.   Also able to discuss patient with RN at [facility]. She reported that patient is on a mechanical soft diet (dysphagia 3) with thin liquids at baseline. She is able to feed herself. RN reports patient's intake is variable, but that she typically eats "fairly well" at meals. She reports patient has been weight stable per the weights in chart.   Weights in our chart fluctuate between 77-100 lbs, so hard to identify clear weight trend.   Meal Completion: 40-90% per chart  Medications reviewed and include: Oscal with D 1 tablet BID, Medline mouth rinse,  potassium chloride 20 mEq BID, azithromycin, ceftriaxone.   Labs reviewed: BUN 30, Phosphorus 1.8.   Nutrition-Focused physical exam completed. Findings are severe fat depletion, severe muscle depletion, and no edema.   Discussed with RN.  Diet Order:  DIET - DYS 1 Room service appropriate? Yes with Assist; Fluid consistency: Thin  Skin:  Wound (see comment) (MSAD to perineum)  Last BM:  PTA (10/08/2016 per chart)  Height:   Ht Readings from Last 1 Encounters:  10/09/16 5\' 2"  (1.575 m)    Weight:   Wt Readings from Last 1 Encounters:  10/09/16 87 lb 14.4 oz (39.9 kg)    Ideal Body Weight:  50 kg  BMI:  Body mass index is 16.08 kg/m.  Estimated Nutritional Needs:   Kcal:  1200-1400 (30-35 kcal/kg)  Protein:  60-72 grams (1.5-1.8 grams/kg)  Fluid:  1 L/day (25 ml/kg)  EDUCATION NEEDS:   No education needs identified at this time  Helane RimaLeanne Vincie Linn, MS, RD, LDN Pager: [phone] After Hours Pager: [phone]

## 2016-10-12 LAB — MAGNESIUM: Magnesium: 1.9 mg/dL (ref 1.7–2.4)

## 2016-10-12 LAB — BASIC METABOLIC PANEL
ANION GAP: 6 (ref 5–15)
BUN: 26 mg/dL — AB (ref 6–20)
CALCIUM: 8.5 mg/dL — AB (ref 8.9–10.3)
CO2: 31 mmol/L (ref 22–32)
Chloride: 111 mmol/L (ref 101–111)
Creatinine, Ser: 0.6 mg/dL (ref 0.44–1.00)
GFR calc Af Amer: >60 mL/min (ref >60)
GFR calc non Af Amer: >60 mL/min (ref >60)
GLUCOSE: 111 mg/dL — AB (ref 65–99)
POTASSIUM: 4 mmol/L (ref 3.5–5.1)
Sodium: 148 mmol/L — ABNORMAL HIGH (ref 135–145)

## 2016-10-12 LAB — PHOSPHORUS: Phosphorus: 1.7 mg/dL — ABNORMAL LOW (ref 2.5–4.6)

## 2016-10-12 LAB — SODIUM: SODIUM: 145 mmol/L (ref 135–145)

## 2016-10-12 MED ORDER — BENZONATATE 100 MG PO CAPS: 100.0000 mg | ORAL_CAPSULE | Freq: Three times a day (TID) | ORAL | Status: DC | PRN

## 2016-10-12 MED ORDER — SODIUM CHLORIDE 0.45 % IV SOLN
Freq: Once | INTRAVENOUS | Status: AC
  Administered 2016-10-12: 12:00:00 via INTRAVENOUS

## 2016-10-12 MED ORDER — ACETAMINOPHEN 325 MG PO TABS: 650.0000 mg | ORAL_TABLET | Freq: Four times a day (QID) | ORAL | Status: DC | PRN

## 2016-10-12 MED ORDER — K PHOS MONO-SOD PHOS DI & MONO 155-852-130 MG PO TABS
500.0000 mg | ORAL_TABLET | ORAL | Status: AC
  Administered 2016-10-12 (×3): 500 mg via ORAL
  Filled 2016-10-12 (×5): qty 2

## 2016-10-12 MED ORDER — AZITHROMYCIN 500 MG PO TABS
250.0000 mg | ORAL_TABLET | Freq: Every day | ORAL | Status: DC
  Administered 2016-10-12 – 2016-10-13 (×2): 250 mg via ORAL
  Filled 2016-10-12 (×2): qty 1

## 2016-10-12 NOTE — Progress Notes (Signed)
  Speech Language Pathology Treatment: Dysphagia  Patient Details Name: Allison Sullivan MRN: [id number] DOB: 1931/03/29 Today's Date: 10/12/2016 Time: 1415-1500 SLP Time Calculation (min) (ACUTE ONLY): 45 min  Assessment / Plan / Recommendation Clinical Impression  Pt seen today for toleration of diet; coughing noted intermittently w/ thin liquids.   HPI HPI: Pt is a 83 y.o. female with a known history of Dementia, COPD, degenerative joint disease, depression, hyperlipidemia, hypertension, stroke, thyroid disease- lives in a memory care unit. For last 3-4 days she has some cough and shortness of breath which was progressively getting worse and the nursing home staff decided to send her to emergency room as her oxygen saturation was in 70s on room air. Upon admission, she was noted to have bibasilar pneumonia and her white cell count was high and she was requiring supplemental oxygen. Pt was unable to participate in giving history d/t Dementia. Pt appears to require moderate assistance w/ ADLs including eating/drinking; answered questions inconsistently; soft spoken. Appeared weak; noted reduced weight. Easily SOB w/ any exertion.       SLP Plan  Continue with current plan of care (downgrade diet to Nectar liquids); consider need for objective swallow study for further assessment       Recommendations  Diet recommendations: Dysphagia 1 (puree);Nectar-thick liquid Liquids provided via: Cup;No straw Medication Administration: Crushed with puree Supervision: Patient able to self feed;Staff to assist with self feeding;Intermittent supervision to cue for compensatory strategies Compensations: Minimize environmental distractions;Slow rate;Small sips/bites;Lingual sweep for clearance of pocketing;Multiple dry swallows after each bite/sip;Follow solids with liquid (frequent rest breaks) Postural Changes and/or Swallow Maneuvers: Seated upright 90 degrees;Upright 30-60 min after meal                 General recommendations:  (Dietician f/u) Oral Care Recommendations: Oral care BID;Staff/trained caregiver to provide oral care Follow up Recommendations:  (TBD) SLP Visit Diagnosis: Dysphagia, oropharyngeal phase (R13.12) Plan: Continue with current plan of care (downgrade diet to Nectar liquids)       GO                Jerilynn SomKatherine Watson, MS, CCC-SLP Watson,Katherine 10/12/2016, 3:33 PM

## 2016-10-12 NOTE — Progress Notes (Signed)
MEDICATION RELATED CONSULT NOTE   Pharmacy Consult for Electrolyte Indication: hypokalemia  No Known Allergies  Patient Measurements: Height: 5\' 2"  (157.5 cm) Weight: 87 lb 14.4 oz (39.9 kg) IBW/kg (Calculated) : 50.1   Vital Signs: Temp: 98.3 F (36.8 C) (07/10 0457) BP: 137/56 (07/10 0457) Pulse Rate: 86 (07/10 0457) Intake/Output from previous day: No intake/output data recorded. Intake/Output from this shift: No intake/output data recorded.  Labs:  Recent Labs  10/09/16 1842 10/10/16 0342 10/10/16 0406 10/11/16 0544 10/12/16 0430  WBC 16.0* 12.8*  --   --   --   HGB 13.4 11.7*  --   --   --   HCT 39.2 34.6*  --   --   --   PLT 374 320  --   --   --   CREATININE 0.69 0.64  --  0.49 0.60  MG  --   --  2.0 2.2 1.9  PHOS  --   --   --  1.8* 1.7*  ALBUMIN 2.9*  --   --   --   --   PROT 7.1  --   --   --   --   AST 50*  --   --   --   --   ALT 25  --   --   --   --   ALKPHOS 92  --   --   --   --   BILITOT 1.0  --   --   --   --    Potassium (mmol/L)  Date Value  10/12/2016 4.0  10/09/2012 3.6   Magnesium (mg/dL)  Date Value  16/10/960407/01/2017 1.9   Phosphorus (mg/dL)  Date Value  54/09/811907/01/2017 1.7 (L)    Estimated Creatinine Clearance: 33 mL/min (by C-G formula based on SCr of 0.6 mg/dL).  Assessment: 83 yo female with K of 2.5 initially. Initially ordered KCl 10 mEq IV x4 runs. Only received 1 run as IV no longer usable. Scheduled KCl 20 mEq PO BID order noted.   7/10 K 4.0 Mag 1.9 Phos 1.7  Goal of Therapy:  K 3.5-5.0 Mag 1.8-2.0  Plan:  KPhos Neutral 2 tab PO q4h x4 doses Will d/c standing KCl orders as giving KPhos today Will follow up on AM labs.  Pharmacy will continue to follow.  Crist FatHannah Dmoni Fortson, PharmD, BCPS Clinical Pharmacist 10/12/2016 9:52 AM

## 2016-10-12 NOTE — Care Management Important Message (Signed)
Important Message  Patient Details  Name: Teodora MediciMarie D Valliant MRN: [id number] Date of Birth: 12-18-1930   Medicare Important Message Given:  Yes    Gwenette GreetBrenda S Laporchia Nakajima, RN 10/12/2016, 8:13 AM

## 2016-10-12 NOTE — Progress Notes (Signed)
[facility] - [place] [facility]   PATIENT NAME: Allison Sullivan    MR#:  [id number]  DATE OF BIRTH:  March 05, 1931  SUBJECTIVE:  Came in with increasing shortness of breath cough found to have hypoxia. Chest showed pneumonia. Patient has advanced dementia is pleasantly confused Feeling the room.  REVIEW OF SYSTEMS:   Review of Systems  Constitutional: Negative for chills, fever and weight loss.  HENT: Negative for ear discharge, ear pain and nosebleeds.   Eyes: Negative for blurred vision, pain and discharge.  Respiratory: Positive for cough. Negative for sputum production, shortness of breath, wheezing and stridor.   Cardiovascular: Negative for chest pain, palpitations, orthopnea and PND.  Gastrointestinal: Negative for abdominal pain, diarrhea, nausea and vomiting.  Genitourinary: Negative for frequency and urgency.  Musculoskeletal: Negative for back pain and joint pain.  Neurological: Positive for weakness. Negative for sensory change, speech change and focal weakness.  Psychiatric/Behavioral: Negative for depression and hallucinations. The patient is not nervous/anxious.    Tolerating Diet: Dysphagia 1 Tolerating PT: Rehabilitation  DRUG ALLERGIES:  No Known Allergies  VITALS:  Blood pressure (!) 119/50, pulse 88, temperature 98.7 F (37.1 C), resp. rate 20, height 5\' 2"  (1.575 m), weight 39.9 kg (87 lb 14.4 oz), SpO2 92 %.  PHYSICAL EXAMINATION:   Physical Exam  GENERAL:  83 y.o.-year-old patient lying in the bed with no acute distress. thin EYES: Pupils equal, round, reactive to light and accommodation. No scleral icterus. Extraocular muscles intact.  HEENT: Head atraumatic, normocephalic. Oropharynx and nasopharynx clear.  NECK:  Supple, no jugular venous distention. No thyroid enlargement, no tenderness.  LUNGS: Coarse breath sounds bilaterally, no wheezing, rales, rhonchi. No use of accessory muscles of respiration.  CARDIOVASCULAR: S1, S2  normal. No murmurs, rubs, or gallops.  ABDOMEN: Soft, nontender, nondistended. Bowel sounds present. No organomegaly or mass.  EXTREMITIES: No cyanosis, clubbing or edema b/l.    NEUROLOGIC: Moves all extremities well. Limited exam due to advanced dementia  PSYCHIATRIC:  patient is alert and awake.  SKIN: No obvious rash, lesion, or ulcer.   LABORATORY PANEL:  CBC  Recent Labs Lab 10/10/16 0342  WBC 12.8*  HGB 11.7*  HCT 34.6*  PLT 320    Chemistries   Recent Labs Lab 10/09/16 1842  10/12/16 0430 10/12/16 1315  NA 140  < > 148* 145  K 3.1*  < > 4.0  --   CL 97*  < > 111  --   CO2 29  < > 31  --   GLUCOSE 163*  < > 111*  --   BUN 41*  < > 26*  --   CREATININE 0.69  < > 0.60  --   CALCIUM 9.1  < > 8.5*  --   MG  --   < > 1.9  --   AST 50*  --   --   --   ALT 25  --   --   --   ALKPHOS 92  --   --   --   BILITOT 1.0  --   --   --   < > = values in this interval not displayed. Cardiac Enzymes  Recent Labs Lab 10/09/16 1842  TROPONINI 0.09*   RADIOLOGY:  No results found. ASSESSMENT AND PLAN:  Allison Sullivan  is a 83 y.o. female with a known history of COPD, degenerative joint disease, depression, hyperlipidemia, hypertension, stroke, thyroid disease- lives in a memory care unit. For last 3-4 days  she has some cough and shortness of breath. Which is progressively getting worse and today the nursing home staff decided to send her to emergency room as her oxygen saturation was in 70s on room air.  * Acute hypoxic respiratory failure secondary to pneumonia. -continue supplemental oxygen and try to taper, check for oxygen requirement at the time of discharge. - IV ceftriaxone and azithromycin for pneumonia---change to oral abxs in am -Blood cultures negative. -cont Pureed diet  *sepsis due to Pneumonia As above  *Hypophosphatemia and mild hypernatremia -replete with IVF and oral K phos  * Dementia, advanced   Continue sertraline.  * Hypokalemia   We'll  replace orally  -DC hydrochlorthiazide  * recent fracture of forearm  *PT recommends SNF---will d/c in am if continues to improve Patient is from [place] [facility].   D/w son Case discussed with Care Management/Social Worker. Management plans discussed with the family and they are in agreement.  CODE STATUS: DO NOT RESUSCITATE  DVT Prophylaxis: Lovenox  TOTAL TIME TAKING CARE OF THIS PATIENT: 25 minutes.  >50% time spent on counselling and coordination of care patient's son Mr. Leu  POSSIBLE D/C IN one  DAYS, DEPENDING ON CLINICAL CONDITION.  Note: This dictation was prepared with Dragon dictation along with smaller phrase technology. Any transcriptional errors that result from this process are unintentional.  Sharan Mcenaney M.D on 10/12/2016 at 3:03 PM  Between 7am to 6pm - Pager - [phone]  After 6pm go to [email or web address] - Social research officer, government  [facility]  Office  [phone]  CC: Primary care physician; Patrice Paradise, MD

## 2016-10-12 NOTE — Progress Notes (Signed)
Physical Therapy Treatment Patient Details Name: Allison Sullivan MRN: [id number] DOB: 11/21/1930 Today's Date: 10/12/2016    History of Present Illness Pt is a pleasant 83 yo F, admitted to acute care with acute systoloic heart failure due to valvular disease. PMH includes: COPD, deginerative joint disease, depression, HLD< HTN, stroke, thyroid disease, anxiety, dementia, OP, and spondylolisthesis.     PT Comments    Pt agreeable to PT; denies pain, but grimaces at times with Right lower extremity movement. Pt attempts to converse several times; however, unable to understand unless simple "yes/no" words. Pt participates with Bilateral lower extremities exercises requiring increased verbal and tactile cueing and assist with all movement. Pt offered to sit edge of bed or in chair several times throughout session; however, pt refuses at all attempts. Continue PT to progress movement, strength to improve functional mobility.   Follow Up Recommendations  SNF     Equipment Recommendations  None recommended by PT    Recommendations for Other Services       Precautions / Restrictions Precautions Precautions: Fall Precaution Comments: Pt with advanced dementia.  Required Braces or Orthoses: Other Brace/Splint Other Brace/Splint: Right wrist brace Restrictions Weight Bearing Restrictions: No    Mobility  Bed Mobility               General bed mobility comments: Not tested; offered several times during session, but pt refuses  Transfers                    Ambulation/Gait                 Stairs            Wheelchair Mobility    Modified Rankin (Stroke Patients Only)       Balance                                            Cognition Arousal/Alertness: Awake/alert Behavior During Therapy: Flat affect Overall Cognitive Status: History of cognitive impairments - at baseline Area of Impairment: Memory;Problem  solving;Safety/judgement;Following commands;Attention                       Following Commands: Follows one step commands inconsistently              Exercises General Exercises - Lower Extremity Ankle Circles/Pumps: AAROM;Both;10 reps;Supine Quad Sets:  (attempted; unable to follow instruction) Short Arc Quad: AAROM;Both;10 reps;Supine Heel Slides: AAROM;Both;10 reps;Supine Hip ABduction/ADduction: AAROM;Both;10 reps;Supine Straight Leg Raises: AAROM;Both;10 reps;Supine    General Comments        Pertinent Vitals/Pain Pain Assessment: No/denies pain    Home Living                      Prior Function            PT Goals (current goals can now be found in the care plan section) Progress towards PT goals: Not progressing toward goals - comment    Frequency    Min 2X/week      PT Plan Current plan remains appropriate    Co-evaluation              AM-PAC PT "6 Clicks" Daily Activity  Outcome Measure  Difficulty turning over in bed (including adjusting bedclothes, sheets and blankets)?: Total Difficulty moving from lying on back to  sitting on the side of the bed? : Total Difficulty sitting down on and standing up from a chair with arms (e.g., wheelchair, bedside commode, etc,.)?: Total Help needed moving to and from a bed to chair (including a wheelchair)?: Total Help needed walking in hospital room?: Total Help needed climbing 3-5 steps with a railing? : Total 6 Click Score: 6    End of Session Equipment Utilized During Treatment: Oxygen Activity Tolerance: Other (comment) (cognition) Patient left: in bed;with call bell/phone within reach;with bed alarm set Nurse Communication: Other (comment) (pt requesting nurse, but unable to understand why) PT Visit Diagnosis: Other abnormalities of gait and mobility (R26.89);Muscle weakness (generalized) (M62.81);Unsteadiness on feet (R26.81)     Time: 5643-32951214-1232 PT Time Calculation (min)  (ACUTE ONLY): 18 min  Charges:  $Therapeutic Exercise: 8-22 mins                    G Codes:        Scot DockHeidi E Barnes, PTA 10/12/2016, 12:49 PM

## 2016-10-12 NOTE — Clinical Social Work Note (Signed)
CSW spoke with pt's family and pt will discharge to [facility] when stable. Per MD, likely discharge tomorrow. CSW confirmed bed with [facility] and will be able to accept pt at discharge. CSW will continue to follow.   Dede QuerySarah Girtrude Enslin, MSW, LCSW  Clinical Social Worker [phone]

## 2016-10-13 DIAGNOSIS — F329 Major depressive disorder, single episode, unspecified: Secondary | ICD-10-CM | POA: Diagnosis not present

## 2016-10-13 DIAGNOSIS — E785 Hyperlipidemia, unspecified: Secondary | ICD-10-CM | POA: Diagnosis not present

## 2016-10-13 DIAGNOSIS — G301 Alzheimer's disease with late onset: Secondary | ICD-10-CM | POA: Diagnosis not present

## 2016-10-13 DIAGNOSIS — F419 Anxiety disorder, unspecified: Secondary | ICD-10-CM | POA: Diagnosis not present

## 2016-10-13 DIAGNOSIS — J440 Chronic obstructive pulmonary disease with acute lower respiratory infection: Secondary | ICD-10-CM | POA: Diagnosis not present

## 2016-10-13 DIAGNOSIS — E441 Mild protein-calorie malnutrition: Secondary | ICD-10-CM | POA: Diagnosis not present

## 2016-10-13 DIAGNOSIS — S52521A Torus fracture of lower end of right radius, initial encounter for closed fracture: Secondary | ICD-10-CM | POA: Diagnosis not present

## 2016-10-13 DIAGNOSIS — J189 Pneumonia, unspecified organism: Secondary | ICD-10-CM | POA: Diagnosis not present

## 2016-10-13 DIAGNOSIS — Z9911 Dependence on respirator [ventilator] status: Secondary | ICD-10-CM | POA: Diagnosis not present

## 2016-10-13 DIAGNOSIS — J9601 Acute respiratory failure with hypoxia: Secondary | ICD-10-CM | POA: Diagnosis not present

## 2016-10-13 DIAGNOSIS — M1990 Unspecified osteoarthritis, unspecified site: Secondary | ICD-10-CM | POA: Diagnosis not present

## 2016-10-13 DIAGNOSIS — J159 Unspecified bacterial pneumonia: Secondary | ICD-10-CM | POA: Diagnosis not present

## 2016-10-13 DIAGNOSIS — Y95 Nosocomial condition: Secondary | ICD-10-CM | POA: Diagnosis not present

## 2016-10-13 DIAGNOSIS — M810 Age-related osteoporosis without current pathological fracture: Secondary | ICD-10-CM | POA: Diagnosis not present

## 2016-10-13 DIAGNOSIS — J441 Chronic obstructive pulmonary disease with (acute) exacerbation: Secondary | ICD-10-CM | POA: Diagnosis not present

## 2016-10-13 DIAGNOSIS — F0280 Dementia in other diseases classified elsewhere without behavioral disturbance: Secondary | ICD-10-CM | POA: Diagnosis not present

## 2016-10-13 DIAGNOSIS — F0390 Unspecified dementia without behavioral disturbance: Secondary | ICD-10-CM | POA: Diagnosis not present

## 2016-10-13 DIAGNOSIS — I10 Essential (primary) hypertension: Secondary | ICD-10-CM | POA: Diagnosis not present

## 2016-10-13 DIAGNOSIS — J9690 Respiratory failure, unspecified, unspecified whether with hypoxia or hypercapnia: Secondary | ICD-10-CM | POA: Diagnosis not present

## 2016-10-13 DIAGNOSIS — Z23 Encounter for immunization: Secondary | ICD-10-CM | POA: Diagnosis not present

## 2016-10-13 LAB — BASIC METABOLIC PANEL
ANION GAP: 8 (ref 5–15)
BUN: 22 mg/dL — ABNORMAL HIGH (ref 6–20)
CO2: 30 mmol/L (ref 22–32)
Calcium: 7.8 mg/dL — ABNORMAL LOW (ref 8.9–10.3)
Chloride: 107 mmol/L (ref 101–111)
Creatinine, Ser: 0.43 mg/dL — ABNORMAL LOW (ref 0.44–1.00)
GFR calc Af Amer: >60 mL/min (ref >60)
GLUCOSE: 105 mg/dL — AB (ref 65–99)
POTASSIUM: 3.5 mmol/L (ref 3.5–5.1)
Sodium: 145 mmol/L (ref 135–145)

## 2016-10-13 LAB — CBC
HCT: 31.3 % — ABNORMAL LOW (ref 35.0–47.0)
Hemoglobin: 11.1 g/dL — ABNORMAL LOW (ref 12.0–16.0)
MCH: 32.7 pg (ref 26.0–34.0)
MCHC: 35.4 g/dL (ref 32.0–36.0)
MCV: 92.3 fL (ref 80.0–100.0)
PLATELETS: 373 10*3/uL (ref 150–440)
RBC: 3.39 MIL/uL — ABNORMAL LOW (ref 3.80–5.20)
RDW: 13.8 % (ref 11.5–14.5)
WBC: 11.6 10*3/uL — AB (ref 3.6–11.0)

## 2016-10-13 LAB — MAGNESIUM: MAGNESIUM: 1.7 mg/dL (ref 1.7–2.4)

## 2016-10-13 LAB — PHOSPHORUS: Phosphorus: 4.4 mg/dL (ref 2.5–4.6)

## 2016-10-13 MED ORDER — POTASSIUM CHLORIDE CRYS ER 20 MEQ PO TBCR
20.0000 meq | EXTENDED_RELEASE_TABLET | Freq: Two times a day (BID) | ORAL | Status: DC
  Administered 2016-10-13: 10:00:00 20 meq via ORAL

## 2016-10-13 MED ORDER — CEFUROXIME AXETIL 500 MG PO TABS
500.0000 mg | ORAL_TABLET | Freq: Two times a day (BID) | ORAL | Status: DC
  Administered 2016-10-13: 500 mg via ORAL
  Filled 2016-10-13 (×2): qty 1

## 2016-10-13 MED ORDER — K PHOS MONO-SOD PHOS DI & MONO 155-852-130 MG PO TABS
500.0000 mg | ORAL_TABLET | Freq: Once | ORAL | Status: AC
  Administered 2016-10-13: 500 mg via ORAL
  Filled 2016-10-13: qty 2

## 2016-10-13 MED ORDER — ADULT MULTIVITAMIN W/MINERALS CH: 1.0000 | ORAL_TABLET | Freq: Every day | ORAL | 0 refills | Status: AC

## 2016-10-13 MED ORDER — BENZONATATE 100 MG PO CAPS: 100.0000 mg | ORAL_CAPSULE | Freq: Three times a day (TID) | ORAL | 0 refills | Status: AC | PRN

## 2016-10-13 MED ORDER — CEFUROXIME AXETIL 500 MG PO TABS: 500.0000 mg | ORAL_TABLET | Freq: Two times a day (BID) | ORAL | 0 refills | Status: AC

## 2016-10-13 MED ORDER — AZITHROMYCIN 250 MG PO TABS: 250.0000 mg | ORAL_TABLET | Freq: Every day | ORAL | 0 refills | Status: AC

## 2016-10-13 NOTE — Clinical Social Work Note (Signed)
Pt is ready for discharge today and will go to [facility]. Facility is ready to accept pt as facility has received discharge information. Pt's family is aware and agreeable to discharge plan. RN called report. [facility] will [facility] to facility. CSW is signing off as no further needs identified.   Dede QuerySarah Curties Conigliaro, MSW, LCSW  Clinical Social Worker  [phone]

## 2016-10-13 NOTE — Discharge Summary (Signed)
[facility] [place] [facility]   PATIENT NAME: Toluwani Ruder    MR#:  [id number]  DATE OF BIRTH:  10-Dec-1930  DATE OF ADMISSION:  10/09/2016 ADMITTING PHYSICIAN: Altamese Dilling, MD  DATE OF DISCHARGE: 10/13/2016  PRIMARY CARE PHYSICIAN: Patrice Paradise, MD    ADMISSION DIAGNOSIS:  HCAP (healthcare-associated pneumonia) [J18.9] Sepsis, due to unspecified organism (HCC) [A41.9]  DISCHARGE DIAGNOSIS:  Acute hypoxic respiratory failure due to Pneumonia and copd Dementia Sepsis on admission-resolved SECONDARY DIAGNOSIS:   Past Medical History:  Diagnosis Date  . Anxiety   . Arthritis   . COPD (chronic obstructive pulmonary disease) (HCC)    Documented  . Degenerative joint disease    degerative disc disease  . Dementia   . Depression   . Hyperlipidemia   . Hypertension   . Osteoporosis   . Spondylolisthesis    with foraminal stenosis  . Stroke [facility])    mini stroke-2 yrs ago  . Thyroid disease 1979   Thyroid lesion    HOSPITAL COURSE:   MarieMeltonis a 83 y.o.femalewith a known history of COPD, degenerative joint disease, depression, hyperlipidemia, hypertension, stroke, thyroid disease- lives in a memory care unit. For last 3-4 days she has some cough and shortness of breath. Which is progressively getting worse and today the nursing home staff decided to send her to emergency room as her oxygen saturation was in 70s on room air.  * Acute hypoxic respiratory failure secondary to pneumonia. -continue supplemental oxygen and try to taper, check for oxygen requirement at the time of discharge. -IV ceftriaxone and azithromycin for pneumonia---change to oral abxs today -Blood cultures negative. -cont Pureed diet -Use oxygen as needed to keep sats >92%  *sepsis due to Pneumonia As above  *Hypophosphatemia and mild hypernatremia---resolved -repleted  with IVF and oral K phos  * Dementia, advanced Continue  sertraline.  * Hypokalemia We'll replace orally  -DC hydrochlorthiazide  * recent fracture of forearm  *PT recommends SNF---will d/ctoday Patient was from [facility].    CONSULTS OBTAINED:    DRUG ALLERGIES:  No Known Allergies  DISCHARGE MEDICATIONS:   Current Discharge Medication List    START taking these medications   Details  azithromycin (ZITHROMAX) 250 MG tablet Take 1 tablet (250 mg total) by mouth daily. Qty: 1 tablet, Refills: 0    benzonatate (TESSALON) 100 MG capsule Take 1 capsule (100 mg total) by mouth 3 (three) times daily as needed for cough. Qty: 20 capsule, Refills: 0    cefUROXime (CEFTIN) 500 MG tablet Take 1 tablet (500 mg total) by mouth 2 (two) times daily with a meal. Qty: 8 tablet, Refills: 0    Multiple Vitamin (MULTIVITAMIN WITH MINERALS) TABS tablet Take 1 tablet by mouth daily. Qty: 30 tablet, Refills: 0      CONTINUE these medications which have NOT CHANGED   Details  albuterol (PROVENTIL) (2.5 MG/3ML) 0.083% nebulizer solution Take 3 mLs by nebulization every 6 (six) hours as needed for wheezing.    ALPRAZolam (XANAX) 0.25 MG tablet Take 0.25 mg by mouth 3 (three) times daily as needed for anxiety.    aspirin EC 81 MG tablet Take 81 mg by mouth daily.    Calcium Carbonate-Vitamin D (CALTRATE 600+D PO) Take 1 tablet by mouth 2 (two) times daily. sporatic    estradiol (ESTRACE VAGINAL) 0.1 MG/GM vaginal cream Apply 0.5mg  (pea-sized amount)  just inside the vaginal introitus with a finger-tip on  Monday, Wednesday and Friday nights. Qty: 30  g, Refills: 12   Associated Diagnoses: Vaginal atrophy    hydrochlorothiazide (HYDRODIURIL) 12.5 MG tablet Take 12.5 mg by mouth daily.    HYDROcodone-acetaminophen (NORCO/VICODIN) 5-325 MG tablet Take 1 tablet by mouth every 8 (eight) hours as needed for moderate pain.    LORazepam (ATIVAN) 0.5 MG tablet Take 0.5 mg by mouth at bedtime as needed for anxiety.    saccharomyces boulardii  (FLORASTOR) 250 MG capsule Take 250 mg by mouth daily.    sertraline (ZOLOFT) 25 MG tablet Take 25 mg by mouth daily.        If you experience worsening of your admission symptoms, develop shortness of breath, life threatening emergency, suicidal or homicidal thoughts you must seek medical attention immediately by calling 911 or calling your MD immediately  if symptoms less severe.  You Must read complete instructions/literature along with all the possible adverse reactions/side effects for all the Medicines you take and that have been prescribed to you. Take any new Medicines after you have completely understood and accept all the possible adverse reactions/side effects.   Please note  You were cared for by a hospitalist during your hospital stay. If you have any questions about your discharge medications or the care you received while you were in the hospital after you are discharged, you can call the unit and asked to speak with the hospitalist on call if the hospitalist that took care of you is not available. Once you are discharged, your primary care physician will handle any further medical issues. Please note that NO REFILLS for any discharge medications will be authorized once you are discharged, as it is imperative that you return to your primary care physician (or establish a relationship with a primary care physician if you do not have one) for your aftercare needs so that they can reassess your need for medications and monitor your lab values. Today   SUBJECTIVE  Doing well Some cough   VITAL SIGNS:  Blood pressure (!) 132/51, pulse 76, temperature 97.8 F (36.6 C), temperature source Oral, resp. rate 18, height 5\' 2"  (1.575 m), weight 39.9 kg (87 lb 14.4 oz), SpO2 93 %.  I/O:   Intake/Output Summary (Last 24 hours) at 10/13/16 0734 Last data filed at 10/13/16 0243  Gross per 24 hour  Intake             1078 ml  Output                0 ml  Net             1078 ml     PHYSICAL EXAMINATION:  GENERAL:  83 y.o.-year-old patient lying in the bed with no acute distress. thin EYES: Pupils equal, round, reactive to light and accommodation. No scleral icterus. Extraocular muscles intact.  HEENT: Head atraumatic, normocephalic. Oropharynx and nasopharynx clear.  NECK:  Supple, no jugular venous distention. No thyroid enlargement, no tenderness.  LUNGS: coarse breath sounds bilaterally, no wheezing, rales,rhonchi or crepitation. No use of accessory muscles of respiration.  CARDIOVASCULAR: S1, S2 normal. No murmurs, rubs, or gallops.  ABDOMEN: Soft, non-tender, non-distended. Bowel sounds present. No organomegaly or mass.  EXTREMITIES: No pedal edema, cyanosis, or clubbing.  NEUROLOGIC: Cranial nerves II through XII are intact. Muscle strength 5/5 in all extremities. Sensation intact. Gait not checked.  PSYCHIATRIC: The patient is alert and awake SKIN: No obvious rash, lesion, or ulcer.   DATA REVIEW:   CBC   Recent Labs Lab 10/13/16 0520  WBC  11.6*  HGB 11.1*  HCT 31.3*  PLT 373    Chemistries   Recent Labs Lab 10/09/16 1842  10/13/16 0520  NA 140  < > 145  K 3.1*  < > 3.5  CL 97*  < > 107  CO2 29  < > 30  GLUCOSE 163*  < > 105*  BUN 41*  < > 22*  CREATININE 0.69  < > 0.43*  CALCIUM 9.1  < > 7.8*  MG  --   < > 1.7  AST 50*  --   --   ALT 25  --   --   ALKPHOS 92  --   --   BILITOT 1.0  --   --   < > = values in this interval not displayed.  Microbiology Results   Recent Results (from the past 240 hour(s))  Blood Culture (routine x 2)     Status: None (Preliminary result)   Collection Time: 10/09/16  6:28 PM  Result Value Ref Range Status   Specimen Description BLOOD LEFT ANTECUBITAL  Final   Special Requests   Final    BOTTLES DRAWN AEROBIC AND ANAEROBIC Blood Culture results may not be optimal due to an inadequate volume of blood received in culture bottles   Culture NO GROWTH 4 DAYS  Final   Report Status PENDING  Incomplete   Blood Culture (routine x 2)     Status: None (Preliminary result)   Collection Time: 10/09/16  6:42 PM  Result Value Ref Range Status   Specimen Description BLOOD RIGHT ANTECUBITAL  Final   Special Requests BOTTLES DRAWN AEROBIC AND ANAEROBIC BCAV  Final   Culture NO GROWTH 4 DAYS  Final   Report Status PENDING  Incomplete  MRSA PCR Screening     Status: Abnormal   Collection Time: 10/09/16 11:32 PM  Result Value Ref Range Status   MRSA by PCR POSITIVE (A) NEGATIVE Final    Comment:        The GeneXpert MRSA Assay (FDA approved for NASAL specimens only), is one component of a comprehensive MRSA colonization surveillance program. It is not intended to diagnose MRSA infection nor to guide or monitor treatment for MRSA infections. RESULT CALLED TO, READ BACK BY AND VERIFIED WITH: MELISSA COBB AT 0104 10/10/16.PMH     RADIOLOGY:  No results found.   Management plans discussed with the patient, family and they are in agreement.  CODE STATUS:     Code Status Orders        Start     Ordered   10/09/16 2158  Do not attempt resuscitation (DNR)  Continuous    Question Answer Comment  In the event of cardiac or respiratory ARREST Do not call a "code blue"   In the event of cardiac or respiratory ARREST Do not perform Intubation, CPR, defibrillation or ACLS   In the event of cardiac or respiratory ARREST Use medication by any route, position, wound care, and other measures to relive pain and suffering. May use oxygen, suction and manual treatment of airway obstruction as needed for comfort.      10/09/16 2157    Code Status History    Date Active Date Inactive Code Status Order ID Comments User Context   10/08/2015  6:11 PM 10/10/2015  3:17 PM Full Code [id number]  Houston Siren, MD Inpatient    Advance Directive Documentation     Most Recent Value  Type of Advance Directive  Healthcare Power of [place]  Pre-existing out of facility DNR order (yellow form or pink MOST form)   -  "MOST" Form in Place?  -      TOTAL TIME TAKING CARE OF THIS PATIENT: 40 minutes.    Jaleya Pebley M.D on 10/13/2016 at 7:34 AM  Between 7am to 6pm - Pager - [phone] After 6pm go to [email or web address] - Social research officer, governmentpassword [email or web address] [email or web address]  [facility] [place] [facility]  Office  [phone]  CC: Primary care physician; Patrice ParadiseMcLaughlin, Miriam K, MD

## 2016-10-13 NOTE — Consult Note (Signed)
   [facility]   10/13/2016  Teodora MediciMarie D Thresher 1930/11/28 [id number]   Patient screened for potential [facility] Care Management services. Patient is on the [facility] registry as a benefit of her [employer] medicare . Electronic medical record reveals patient's discharge plan is [facility]. [facility] Care [facility] services not appropriate at this time. If patient's post hospital needs change please place a [facility] Care Management consult. For questions please contact:   Maressa Apollo RN, BSN [facility]  Hospital Liaison  [phone]) Business Mobile [phone]) Toll free office

## 2016-10-13 NOTE — Plan of Care (Signed)
VSS, free of falls during shift.  PAINAD 6/10, received PRN PO Norco 5-325mg  x1 during shift.  Received PRN PO Robitussin DM x1 for cough.  No other needs overnight.  Bed in low position, call bell within reach.  WCTM.

## 2016-10-13 NOTE — Discharge Instructions (Signed)
Use oxygen to keep sats >92% as needed Nebs as needed

## 2016-10-13 NOTE — Progress Notes (Signed)
MEDICATION RELATED CONSULT NOTE   Pharmacy Consult for Electrolyte Indication: hypokalemia  No Known Allergies  Patient Measurements: Height: 5\' 2"  (157.5 cm) Weight: 87 lb 14.4 oz (39.9 kg) IBW/kg (Calculated) : 50.1   Vital Signs: Temp: 97.8 F (36.6 C) (07/11 0449) Temp Source: Oral (07/11 0449) BP: 132/51 (07/11 0449) Pulse Rate: 76 (07/11 0449) Intake/Output from previous day: 07/10 0701 - 07/11 0700 In: 1078 [I.V.:1028; IV Piggyback:50] Out: -  Intake/Output from this shift: No intake/output data recorded.  Labs:  Recent Labs  10/11/16 0544 10/12/16 0430 10/13/16 0520  WBC  --   --  11.6*  HGB  --   --  11.1*  HCT  --   --  31.3*  PLT  --   --  373  CREATININE 0.49 0.60 0.43*  MG 2.2 1.9 1.7  PHOS 1.8* 1.7* 4.4   Potassium (mmol/L)  Date Value  10/13/2016 3.5  10/09/2012 3.6   Magnesium (mg/dL)  Date Value  16/10/960407/02/2017 1.7   Phosphorus (mg/dL)  Date Value  54/09/811907/02/2017 4.4    Estimated Creatinine Clearance: 33 mL/min (A) (by C-G formula based on SCr of 0.43 mg/dL (L)).  Assessment: 83 yo female with K of 2.5 initially. Initially ordered KCl 10 mEq IV x4 runs. Only received 1 run as IV no longer usable. Scheduled KCl 20 mEq PO BID order noted.   7/10 K 4.0 Mag 1.9 Phos 1.7  Goal of Therapy:  K 3.5-5.0 Mag 1.8-2.0  Plan:  KPhos Neutral 2 tab PO q4h x4 doses Will d/c standing KCl orders as giving KPhos today Will follow up on AM labs.  7/11 0520 K 3.5, Mg 1.7, phos 4.4. Resume standing KCl 20 mEq po BID orders and recheck electrolytes tomorrow.  Pharmacy will continue to follow.  Aarian Cleaver A. Dahlia Bailiffookson, PharmD, BCPS Clinical Pharmacist 10/13/2016 8:50 AM

## 2016-10-13 NOTE — Clinical Social Work Placement (Signed)
   CLINICAL SOCIAL WORK PLACEMENT  NOTE  Date:  10/13/2016  Patient Details  Name: Allison Sullivan MRN: [id number] Date of Birth: 03-Jul-1930  Clinical Social Work is seeking post-discharge placement for this patient at the Skilled  Nursing Facility level of care (*CSW will initial, date and re-position this form in  chart as items are completed):  Yes   Patient/family provided with [place] Clinical Social Work Department's list of facilities offering this level of care within the geographic area requested by the patient (or if unable, by the patient's family).  Yes   Patient/family informed of their freedom to choose among providers that offer the needed level of care, that participate in Medicare, Medicaid or managed care program needed by the patient, have an available bed and are willing to accept the patient.  Yes   Patient/family informed of 's ownership interest in [facility] and [facility], as well as of the fact that they are under no obligation to receive care at these facilities.  PASRR submitted to EDS on 10/11/16     PASRR number received on       Existing PASRR number confirmed on       FL2 transmitted to all facilities in geographic area requested by pt/family on 10/11/16     FL2 transmitted to all facilities within larger geographic area on       Patient informed that his/her managed care company has contracts with or will negotiate with certain facilities, including the following:        Yes   Patient/family informed of bed offers received.  [facility] [place]     Physician recommends and patient chooses bed at      Patient to be transferred to [facility] [place] on 10/13/16.  Patient to be transferred to facility [facility]     Patient family notified on 10/13/16 of transfer.  Name of family member notified:  Pt's son, joe     PHYSICIAN       Additional Comment:     _______________________________________________ Dede QuerySarah Rommel Hogston, LCSW 10/13/2016, 11:35 AM

## 2016-10-13 NOTE — Progress Notes (Signed)
Report called to [facility] at [facility] [phone]). Pt going to Room #307. Family to come around lunch time then will call EMS for transport.

## 2016-10-13 NOTE — Progress Notes (Signed)
Patient's son at bedside. EMS here to transport patient to [facility]. Belongings sent with son.

## 2016-10-14 LAB — CULTURE, BLOOD (ROUTINE X 2)
CULTURE: NO GROWTH
CULTURE: NO GROWTH

## 2016-10-18 DIAGNOSIS — F0280 Dementia in other diseases classified elsewhere without behavioral disturbance: Secondary | ICD-10-CM | POA: Diagnosis not present

## 2016-10-18 DIAGNOSIS — J441 Chronic obstructive pulmonary disease with (acute) exacerbation: Secondary | ICD-10-CM | POA: Diagnosis not present

## 2016-10-18 DIAGNOSIS — E441 Mild protein-calorie malnutrition: Secondary | ICD-10-CM | POA: Diagnosis not present

## 2016-10-18 DIAGNOSIS — J159 Unspecified bacterial pneumonia: Secondary | ICD-10-CM | POA: Diagnosis not present

## 2016-10-18 DIAGNOSIS — G301 Alzheimer's disease with late onset: Secondary | ICD-10-CM | POA: Diagnosis not present

## 2016-10-27 DIAGNOSIS — S52521A Torus fracture of lower end of right radius, initial encounter for closed fracture: Secondary | ICD-10-CM | POA: Diagnosis not present

## 2016-10-28 ENCOUNTER — Other Ambulatory Visit: Payer: Self-pay | Admitting: *Deleted

## 2016-10-28 NOTE — Patient Outreach (Signed)
  [facility]) Care Management Post-Acute Care Coordination  10/28/2016  Allison Sullivan Aug 09, 1930 [id number]   Met withTeresa Burt Knack, SW for Allison Sullivan. Reviewed patient case. She confirms that patient will be a  [place] resident of facility and there are no plans to discharge to home at this time.  RNCM will sign off case.  [name]. [name], RN, BSN, [place] Post-Acute Care Coordinator [phone]

## 2016-11-07 ENCOUNTER — Encounter: Payer: Self-pay | Admitting: Emergency Medicine

## 2016-11-07 ENCOUNTER — Emergency Department: Payer: Medicare HMO

## 2016-11-07 ENCOUNTER — Emergency Department
Admission: EM | Admit: 2016-11-07 | Discharge: 2016-11-07 | Disposition: A | Discharge status: Home / Self Care | Payer: Medicare HMO | Attending: Emergency Medicine | Admitting: Emergency Medicine

## 2016-11-07 DIAGNOSIS — Z87891 Personal history of nicotine dependence: Secondary | ICD-10-CM | POA: Insufficient documentation

## 2016-11-07 DIAGNOSIS — S3993XA Unspecified injury of pelvis, initial encounter: Secondary | ICD-10-CM | POA: Diagnosis not present

## 2016-11-07 DIAGNOSIS — S0083XA Contusion of other part of head, initial encounter: Secondary | ICD-10-CM | POA: Diagnosis not present

## 2016-11-07 DIAGNOSIS — Y999 Unspecified external cause status: Secondary | ICD-10-CM | POA: Insufficient documentation

## 2016-11-07 DIAGNOSIS — R102 Pelvic and perineal pain: Secondary | ICD-10-CM | POA: Diagnosis not present

## 2016-11-07 DIAGNOSIS — Z79899 Other long term (current) drug therapy: Secondary | ICD-10-CM | POA: Diagnosis not present

## 2016-11-07 DIAGNOSIS — M7989 Other specified soft tissue disorders: Secondary | ICD-10-CM | POA: Diagnosis not present

## 2016-11-07 DIAGNOSIS — Z7982 Long term (current) use of aspirin: Secondary | ICD-10-CM | POA: Insufficient documentation

## 2016-11-07 DIAGNOSIS — Y939 Activity, unspecified: Secondary | ICD-10-CM | POA: Insufficient documentation

## 2016-11-07 DIAGNOSIS — S0990XA Unspecified injury of head, initial encounter: Secondary | ICD-10-CM | POA: Diagnosis present

## 2016-11-07 DIAGNOSIS — Y92128 Other place in nursing home as the place of occurrence of the external cause: Secondary | ICD-10-CM | POA: Insufficient documentation

## 2016-11-07 DIAGNOSIS — W19XXXA Unspecified fall, initial encounter: Secondary | ICD-10-CM | POA: Diagnosis not present

## 2016-11-07 DIAGNOSIS — R6889 Other general symptoms and signs: Secondary | ICD-10-CM | POA: Diagnosis not present

## 2016-11-07 DIAGNOSIS — Z743 Need for continuous supervision: Secondary | ICD-10-CM | POA: Diagnosis not present

## 2016-11-07 DIAGNOSIS — I10 Essential (primary) hypertension: Secondary | ICD-10-CM | POA: Insufficient documentation

## 2016-11-07 DIAGNOSIS — S0003XA Contusion of scalp, initial encounter: Secondary | ICD-10-CM | POA: Insufficient documentation

## 2016-11-07 DIAGNOSIS — G301 Alzheimer's disease with late onset: Secondary | ICD-10-CM | POA: Insufficient documentation

## 2016-11-07 DIAGNOSIS — M25532 Pain in left wrist: Secondary | ICD-10-CM | POA: Diagnosis not present

## 2016-11-07 DIAGNOSIS — J449 Chronic obstructive pulmonary disease, unspecified: Secondary | ICD-10-CM | POA: Insufficient documentation

## 2016-11-07 NOTE — ED Triage Notes (Signed)
Pt coming from [facility] with unwitnessed fall in dinning room. Allison Sullivan has hematoma on right side of head and complaining of left wrist pain. Allison Sullivan has hx of dementia and is always on 3L [place].

## 2016-11-07 NOTE — ED Provider Notes (Signed)
[facility] Emergency Department Provider Note  ____________________________________________   First MD Initiated Contact with Patient 11/07/16 1947     (approximate)  I have reviewed the triage vital signs and the nursing notes.   HISTORY  Chief Complaint Fall   HPI Allison Sullivan is a 83 y.o. female with a history of TIA as well as dementia who is presenting the emergency department today after a fall. Per EMS, the patient had an unwitnessed fall of the dining all of her skilled nursing facility. The patient does not recall the events and is only complaining of pain to her left middle finger at this time. She was found a hematoma to the right occiput. Takes aspirin for anticoagulation.  I discussed the case with the patient's daughter. The daughter says that she has multiple falls in the past whenever she tries to get up by herself. We discussed workup options such as imaging or imaging and labs and urine. The daughter agrees with imaging only at this time. Patient is a DO NOT RESUSCITATE.   Past Medical History:  Diagnosis Date  . Anxiety   . Arthritis   . COPD (chronic obstructive pulmonary disease) (HCC)    Documented  . Degenerative joint disease    degerative disc disease  . Dementia   . Depression   . Hyperlipidemia   . Hypertension   . Osteoporosis   . Spondylolisthesis    with foraminal stenosis  . Stroke [facility])    mini stroke-2 yrs ago  . Thyroid disease 1979   Thyroid lesion    Patient Active Problem List   Diagnosis Date Noted  . Acute on chronic respiratory failure with hypoxia (HCC) 10/09/2016  . Community acquired pneumonia 10/09/2016  . Acute on chronic respiratory failure (HCC) 10/09/2016  . COPD (chronic obstructive pulmonary disease) (HCC) 06/14/2016  . Dementia 06/14/2016  . DJD (degenerative joint disease) 06/14/2016  . Hypertension 06/14/2016  . Osteoporosis 06/14/2016  . Tobacco abuse 06/14/2016  . Hip pain  10/08/2015  . Late onset Alzheimer's disease without behavioral disturbance 11/25/2014  . Trigger ring finger of left hand 08/23/2014    Past Surgical History:  Procedure Laterality Date  . ABDOMINAL HYSTERECTOMY  1997  . APPENDECTOMY  1979  . CHOLECYSTECTOMY  2002  . EXCISION OF ADNEXAL MASS Left   . EYE SURGERY Bilateral    Cataract  . FOOT SURGERY Right   . THYROID SURGERY     thyroid lesion resection  . TRIGGER FINGER RELEASE Left 09/11/2014   Procedure: RELEASE TRIGGER FINGER/A-1 PULLEY;  Surgeon: Christena Flake, MD;  Location: [facility];  Service: Orthopedics;  Laterality: Left;  RISK FOR FALLS    Prior to Admission medications   Medication Sig Start Date End Date Taking? Authorizing Provider  albuterol (PROVENTIL) (2.5 MG/3ML) 0.083% nebulizer solution Take 3 mLs by nebulization every 6 (six) hours as needed for wheezing. 04/29/16 04/29/17  [provider]  ALPRAZolam Prudy Feeler) 0.25 MG tablet Take 0.25 mg by mouth 3 (three) times daily as needed for anxiety.    [provider]  aspirin EC 81 MG tablet Take 81 mg by mouth daily.    [provider]  benzonatate (TESSALON) 100 MG capsule Take 1 capsule (100 mg total) by mouth 3 (three) times daily as needed for cough. 10/13/16   Enedina Finner, MD  Calcium Carbonate-Vitamin D (CALTRATE 600+D PO) Take 1 tablet by mouth 2 (two) times daily. sporatic    [provider]  estradiol (ESTRACE VAGINAL) 0.1 MG/GM vaginal cream Apply 0.5mg  (pea-sized amount)  just inside the vaginal introitus with a finger-tip on  Monday, Wednesday and Friday nights. 05/31/16   Michiel CowboyMcGowan, Shannon A, PA-C  hydrochlorothiazide (HYDRODIURIL) 12.5 MG tablet Take 12.5 mg by mouth daily.    [provider]  HYDROcodone-acetaminophen (NORCO/VICODIN) 5-325 MG tablet Take 1 tablet by mouth every 8 (eight) hours as needed for moderate pain.    [provider]  LORazepam (ATIVAN) 0.5 MG tablet Take 0.5 mg by mouth at  bedtime as needed for anxiety.    [provider]  Multiple Vitamin (MULTIVITAMIN WITH MINERALS) TABS tablet Take 1 tablet by mouth daily. 10/13/16   Enedina FinnerPatel, Sona, MD  saccharomyces boulardii (FLORASTOR) 250 MG capsule Take 250 mg by mouth daily.    [provider]  sertraline (ZOLOFT) 25 MG tablet Take 25 mg by mouth daily. 05/13/16 05/13/17  [provider]    Allergies Patient has no known allergies.  Family History  Problem Relation Age of Onset  . Heart disease Mother 1185       died of attack  . Arthritis Mother   . Hyperlipidemia Mother   . Heart disease Father 3864       died of heart attack  . Prostate cancer Neg Hx   . Kidney cancer Neg Hx   . Bladder Cancer Neg Hx     Social History Social History  Substance Use Topics  . Smoking status: Former Smoker    Packs/day: 1.00    Years: 60.00    Types: Cigarettes  . Smokeless tobacco: Never Used     Comment: quit 04/14/2016  . Alcohol use No    Review of Systems  Level V caveat secondary to dementia.  ____________________________________________   PHYSICAL EXAM:  VITAL SIGNS: ED Triage Vitals  Enc Vitals Group     BP 11/07/16 1956 (!) 121/59     Pulse Rate 11/07/16 1956 84     Resp --      Temp 11/07/16 1956 97.9 F (36.6 C)     Temp Source 11/07/16 1956 Oral     SpO2 11/07/16 1956 96 %     Weight --      Height --      Head Circumference --      Peak Flow --      Pain Score 11/07/16 1954 8     Pain Loc --      Pain Edu? --      Excl. in GC? --     Constitutional: Alert  Eyes: Conjunctivae are normal.  Head: 3 x 3 cm circular hematoma to the right occiput without any active bleeding.. Nose: No congestion/rhinnorhea. Mouth/Throat: Mucous membranes are moist.  Neck: No stridor.  No tenderness to the midline cervical spine. Patient ranges head and neck freely. Cardiovascular: Normal rate, regular rhythm. Grossly normal heart sounds.   Respiratory: Normal respiratory effort.  No  retractions. Lungs CTAB. Gastrointestinal: Soft and nontender. No distention.  Musculoskeletal: No lower extremity tenderness nor edema.  No joint effusions. No tenderness to the bilateral hips. No tenderness of the hands bilaterally and the patient is a 5 out of 5 grip strength. No deformity or swelling to the hands nor wrists bilaterally. Equal bilateral radial pulses. Neurologic:  Normal speech and language. No gross focal neurologic deficits are appreciated. Skin:  Skin is warm, dry and intact. No rash noted. Psychiatric: Mood and affect are normal. Speech and behavior are normal.  ____________________________________________   LABS (all labs ordered are listed, but only abnormal results are displayed)  Labs Reviewed - No data to display ____________________________________________  EKG   ____________________________________________  RADIOLOGY  Scalp hematoma on the CT. No acute findings on the x-rays. ____________________________________________   PROCEDURES  Procedure(s) performed:   Procedures  Critical Care performed:   ____________________________________________   INITIAL IMPRESSION / ASSESSMENT AND PLAN / ED COURSE  Pertinent labs & imaging results that were available during my care of the patient were reviewed by me and considered in my medical decision making (see chart for details).  ----------------------------------------- 9:02 PM on 11/07/2016 -----------------------------------------  Patient's daughter is at the bedside. Says that the patient is acting at her baseline. Patient with reassuring workup. Likely mechanical fall. No acute findings. Patient will be discharged at this time. Explained the results as well as the plan to the patient and daughter and they're understanding and willing to comply.      ____________________________________________   FINAL CLINICAL IMPRESSION(S) / ED DIAGNOSES  Fall. Scalp hematoma.    NEW MEDICATIONS  STARTED DURING THIS VISIT:  New Prescriptions   No medications on file     Note:  This document was prepared using Dragon voice recognition software and may include unintentional dictation errors.     Myrna BlazerSchaevitz, Patirica Longshore Matthew, MD 11/07/16 2103

## 2016-11-07 NOTE — ED Notes (Signed)
Called EMS for transport. 

## 2016-11-08 DIAGNOSIS — I10 Essential (primary) hypertension: Secondary | ICD-10-CM | POA: Diagnosis not present

## 2016-11-08 DIAGNOSIS — Z0000 Encounter for general adult medical examination without abnormal findings: Secondary | ICD-10-CM | POA: Diagnosis not present

## 2016-11-08 DIAGNOSIS — M6281 Muscle weakness (generalized): Secondary | ICD-10-CM | POA: Diagnosis not present

## 2016-11-08 DIAGNOSIS — J441 Chronic obstructive pulmonary disease with (acute) exacerbation: Secondary | ICD-10-CM | POA: Diagnosis not present

## 2016-11-08 DIAGNOSIS — G301 Alzheimer's disease with late onset: Secondary | ICD-10-CM | POA: Diagnosis not present

## 2016-11-08 DIAGNOSIS — E441 Mild protein-calorie malnutrition: Secondary | ICD-10-CM | POA: Diagnosis not present

## 2016-11-08 DIAGNOSIS — J9611 Chronic respiratory failure with hypoxia: Secondary | ICD-10-CM | POA: Diagnosis not present

## 2016-11-08 DIAGNOSIS — F0280 Dementia in other diseases classified elsewhere without behavioral disturbance: Secondary | ICD-10-CM | POA: Diagnosis not present

## 2016-11-10 DIAGNOSIS — M6281 Muscle weakness (generalized): Secondary | ICD-10-CM | POA: Diagnosis not present

## 2016-11-11 DIAGNOSIS — R52 Pain, unspecified: Secondary | ICD-10-CM | POA: Diagnosis not present

## 2016-11-11 DIAGNOSIS — M25551 Pain in right hip: Secondary | ICD-10-CM | POA: Diagnosis not present

## 2016-11-11 DIAGNOSIS — F0390 Unspecified dementia without behavioral disturbance: Secondary | ICD-10-CM | POA: Diagnosis not present

## 2016-11-11 DIAGNOSIS — M6281 Muscle weakness (generalized): Secondary | ICD-10-CM | POA: Diagnosis not present

## 2016-11-11 DIAGNOSIS — Z5189 Encounter for other specified aftercare: Secondary | ICD-10-CM | POA: Diagnosis not present

## 2016-11-16 DIAGNOSIS — M6281 Muscle weakness (generalized): Secondary | ICD-10-CM | POA: Diagnosis not present

## 2016-11-18 DIAGNOSIS — M6281 Muscle weakness (generalized): Secondary | ICD-10-CM | POA: Diagnosis not present

## 2016-11-19 DIAGNOSIS — M6281 Muscle weakness (generalized): Secondary | ICD-10-CM | POA: Diagnosis not present

## 2016-11-22 DIAGNOSIS — M6281 Muscle weakness (generalized): Secondary | ICD-10-CM | POA: Diagnosis not present

## 2016-11-24 DIAGNOSIS — M6281 Muscle weakness (generalized): Secondary | ICD-10-CM | POA: Diagnosis not present

## 2016-11-26 DIAGNOSIS — M6281 Muscle weakness (generalized): Secondary | ICD-10-CM | POA: Diagnosis not present

## 2016-11-29 DIAGNOSIS — M6281 Muscle weakness (generalized): Secondary | ICD-10-CM | POA: Diagnosis not present

## 2016-12-01 DIAGNOSIS — M6281 Muscle weakness (generalized): Secondary | ICD-10-CM | POA: Diagnosis not present

## 2016-12-03 DIAGNOSIS — M6281 Muscle weakness (generalized): Secondary | ICD-10-CM | POA: Diagnosis not present

## 2018-01-06 ENCOUNTER — Emergency Department: Payer: Medicare (Managed Care)

## 2018-01-06 ENCOUNTER — Other Ambulatory Visit: Payer: Self-pay

## 2018-01-06 ENCOUNTER — Emergency Department
Admission: EM | Admit: 2018-01-06 | Discharge: 2018-01-06 | Disposition: A | Discharge status: Skilled Nursing Facility | Payer: Medicare (Managed Care) | Attending: Emergency Medicine | Admitting: Emergency Medicine

## 2018-01-06 DIAGNOSIS — Y999 Unspecified external cause status: Secondary | ICD-10-CM | POA: Diagnosis not present

## 2018-01-06 DIAGNOSIS — I10 Essential (primary) hypertension: Secondary | ICD-10-CM | POA: Diagnosis not present

## 2018-01-06 DIAGNOSIS — W06XXXA Fall from bed, initial encounter: Secondary | ICD-10-CM | POA: Diagnosis not present

## 2018-01-06 DIAGNOSIS — F0280 Dementia in other diseases classified elsewhere without behavioral disturbance: Secondary | ICD-10-CM | POA: Diagnosis not present

## 2018-01-06 DIAGNOSIS — J449 Chronic obstructive pulmonary disease, unspecified: Secondary | ICD-10-CM | POA: Insufficient documentation

## 2018-01-06 DIAGNOSIS — G301 Alzheimer's disease with late onset: Secondary | ICD-10-CM | POA: Diagnosis not present

## 2018-01-06 DIAGNOSIS — Z7982 Long term (current) use of aspirin: Secondary | ICD-10-CM | POA: Diagnosis not present

## 2018-01-06 DIAGNOSIS — Z87891 Personal history of nicotine dependence: Secondary | ICD-10-CM | POA: Diagnosis not present

## 2018-01-06 DIAGNOSIS — W19XXXA Unspecified fall, initial encounter: Secondary | ICD-10-CM

## 2018-01-06 DIAGNOSIS — Z79899 Other long term (current) drug therapy: Secondary | ICD-10-CM | POA: Diagnosis not present

## 2018-01-06 DIAGNOSIS — Y92122 Bedroom in nursing home as the place of occurrence of the external cause: Secondary | ICD-10-CM | POA: Diagnosis not present

## 2018-01-06 DIAGNOSIS — S0990XA Unspecified injury of head, initial encounter: Secondary | ICD-10-CM | POA: Diagnosis not present

## 2018-01-06 DIAGNOSIS — Y9389 Activity, other specified: Secondary | ICD-10-CM | POA: Insufficient documentation

## 2018-01-06 NOTE — ED Notes (Signed)
Pt's son leaving, will keep door open to watch pt

## 2018-01-06 NOTE — ED Notes (Signed)
Pt unable to sign d/c  

## 2018-01-06 NOTE — ED Provider Notes (Addendum)
[facility] Emergency Department Provider Note  ____________________________________________   First MD Initiated Contact with Patient 01/06/18 [phone]     (approximate)  I have reviewed the triage vital signs and the nursing notes.   HISTORY  Chief Complaint Head Injury   Level 5 caveat:  history/ROS limited by chronic dementia   HPI Allison Sullivan is a 84 y.o. female with severe advanced dementia who presents by EMS for evaluation after a fall.  The paramedics were told that the patient was found on the ground and presumably hit her ahead.  For some reason the bed was then raised and the patient fell again possibly striking her head on the wall.  She is in no distress and reports no pain.  She clearly has advanced dementia but is able to answer simple questions and denies that anything hurts, shortness of breath, etc.  No additional details are available.  Past Medical History:  Diagnosis Date  . Anxiety   . Arthritis   . COPD (chronic obstructive pulmonary disease) (HCC)    Documented  . Degenerative joint disease    degerative disc disease  . Dementia   . Depression   . Hyperlipidemia   . Hypertension   . Osteoporosis   . Spondylolisthesis    with foraminal stenosis  . Stroke [facility])    mini stroke-2 yrs ago  . Thyroid disease 1979   Thyroid lesion    Patient Active Problem List   Diagnosis Date Noted  . Acute on chronic respiratory failure with hypoxia (HCC) 10/09/2016  . Community acquired pneumonia 10/09/2016  . Acute on chronic respiratory failure (HCC) 10/09/2016  . COPD (chronic obstructive pulmonary disease) (HCC) 06/14/2016  . Dementia (HCC) 06/14/2016  . DJD (degenerative joint disease) 06/14/2016  . Hypertension 06/14/2016  . Osteoporosis 06/14/2016  . Tobacco abuse 06/14/2016  . Hip pain 10/08/2015  . Late onset Alzheimer's disease without behavioral disturbance (HCC) 11/25/2014  . Trigger ring finger of left hand 08/23/2014      Past Surgical History:  Procedure Laterality Date  . ABDOMINAL HYSTERECTOMY  1997  . APPENDECTOMY  1979  . CHOLECYSTECTOMY  2002  . EXCISION OF ADNEXAL MASS Left   . EYE SURGERY Bilateral    Cataract  . FOOT SURGERY Right   . THYROID SURGERY     thyroid lesion resection  . TRIGGER FINGER RELEASE Left 09/11/2014   Procedure: RELEASE TRIGGER FINGER/A-1 PULLEY;  Surgeon: Christena Flake, MD;  Location: [facility];  Service: Orthopedics;  Laterality: Left;  RISK FOR FALLS    Prior to Admission medications   Medication Sig Start Date End Date Taking? Authorizing Provider  albuterol (PROVENTIL) (2.5 MG/3ML) 0.083% nebulizer solution Take 2.5 mg by nebulization every 6 (six) hours as needed for wheezing or shortness of breath.   Yes [provider]  aspirin EC 81 MG tablet Take 81 mg by mouth daily.   Yes [provider]  Calcium Carbonate-Vitamin D (CALTRATE 600+D PO) Take 1 tablet by mouth 2 (two) times daily. sporatic   Yes [provider]  hydrochlorothiazide (HYDRODIURIL) 12.5 MG tablet Take 12.5 mg by mouth daily.   Yes [provider]  ipratropium-albuterol (DUONEB) 0.5-2.5 (3) MG/3ML SOLN Take 3 mLs by nebulization 3 (three) times daily.   Yes [provider]  mirtazapine (REMERON) 15 MG tablet Take 7.5 mg by mouth at bedtime.   Yes [provider]  Multiple Vitamin (MULTIVITAMIN WITH MINERALS) TABS tablet Take 1 tablet by  mouth daily. 10/13/16  Yes Enedina Finner, MD  QUEtiapine (SEROQUEL) 25 MG tablet Take 12.5 mg by mouth at bedtime.   Yes [provider]  sertraline (ZOLOFT) 25 MG tablet Take 25 mg by mouth daily.   Yes [provider]  benzonatate (TESSALON) 100 MG capsule Take 1 capsule (100 mg total) by mouth 3 (three) times daily as needed for cough. Patient not taking: Reported on 01/06/2018 10/13/16   Enedina Finner, MD  estradiol (ESTRACE VAGINAL) 0.1 MG/GM vaginal cream Apply 0.5mg  (pea-sized amount)   just inside the vaginal introitus with a finger-tip on  Monday, Wednesday and Friday nights. Patient not taking: Reported on 01/06/2018 05/31/16   Michiel Cowboy A, PA-C    Allergies Patient has no known allergies.  Family History  Problem Relation Age of Onset  . Heart disease Mother 94       died of attack  . Arthritis Mother   . Hyperlipidemia Mother   . Heart disease Father 19       died of heart attack  . Prostate cancer Neg Hx   . Kidney cancer Neg Hx   . Bladder Cancer Neg Hx     Social History Social History   Tobacco Use  . Smoking status: Former Smoker    Packs/day: 1.00    Years: 60.00    Pack years: 60.00    Types: Cigarettes  . Smokeless tobacco: Never Used  . Tobacco comment: quit 04/14/2016  Substance Use Topics  . Alcohol use: No  . Drug use: No    Review of Systems Level 5 caveat:  history/ROS limited by chronic dementia   ____________________________________________   PHYSICAL EXAM:  VITAL SIGNS: ED Triage Vitals  Enc Vitals Group     BP 01/06/18 0400 (!) 148/55     Pulse Rate 01/06/18 0400 66     Resp 01/06/18 0400 20     Temp 01/06/18 0400 97.7 F (36.5 C)     Temp Source 01/06/18 0400 Oral     SpO2 01/06/18 0400 95 %     Weight 01/06/18 0401 40 kg (88 lb 2.9 oz)     Height --      Head Circumference --      Peak Flow --      Pain Score --      Pain Loc --      Pain Edu? --      Excl. in GC? --     Constitutional: Awake and alert, knows her name but otherwise disoriented. Eyes: Conjunctivae are normal.  Pupils are relatively constricted bilaterally but do respond to light. Head: Atraumatic. Nose: No congestion/rhinnorhea. Mouth/Throat: Mucous membranes are moist. Neck: No stridor.  No meningeal signs.  No tenderness to palpation of the cervical spine. Cardiovascular: Normal rate, regular rhythm. Good peripheral circulation. Grossly normal heart sounds. Respiratory: Normal respiratory effort.  No retractions. Lungs  CTAB. Gastrointestinal: Soft and nontender. No distention.  Musculoskeletal: No lower extremity tenderness nor edema. No gross deformities of extremities.  I was able to fully passively range all of her major joints and arms and legs and she has no pain or tenderness reproduced with range of motion. Neurologic:  Normal speech and language. No gross focal neurologic deficits are appreciated.  Skin:  Skin is warm, dry and intact. No rash noted.  Multiple subacute ecchymotic areas suggestive of prior contusions but no acute injuries.  ____________________________________________   LABS (all labs ordered are listed, but only abnormal results are displayed)  Labs Reviewed - No data to display ____________________________________________  EKG  None - EKG not ordered by ED physician ____________________________________________  RADIOLOGY   ED MD interpretation: No acute injuries identified on CT head nor CT cervical spine  Official radiology report(s): Ct Head Wo Contrast  Result Date: 01/06/2018 CLINICAL DATA:  Fall out of bed striking head on wall. Subsequent second fall out of bed. EXAM: CT HEAD WITHOUT CONTRAST CT CERVICAL SPINE WITHOUT CONTRAST TECHNIQUE: Multidetector CT imaging of the head and cervical spine was performed following the standard protocol without intravenous contrast. Multiplanar CT image reconstructions of the cervical spine were also generated. COMPARISON:  Head CT 11/07/2016 FINDINGS: CT HEAD FINDINGS Brain: Unchanged degree of atrophy and moderate to severe chronic small vessel ischemia. Remote lacunar infarct in the right basal ganglia. No intracranial hemorrhage, mass effect, or midline shift. The basilar cisterns are patent. No evidence of territorial infarct or acute ischemia. No extra-axial or intracranial fluid collection. Vascular: Atherosclerosis of skullbase vasculature without hyperdense vessel or abnormal calcification. Skull: No fracture or focal lesion.  Sinuses/Orbits: No acute finding. Other: None. CT CERVICAL SPINE FINDINGS Alignment: Trace anterolisthesis of C2 on C3. No traumatic subluxation. Skull base and vertebrae: No acute fracture. Non fusion posterior arch of C1, a normal variant. The dens and skull base are intact. Soft tissues and spinal canal: No prevertebral fluid or swelling. No visible canal hematoma. Disc levels: Mild for age disc space narrowing at C5-C6 and C6-C7 with endplate spurring. Upper chest: Heterogeneous right lobe of the thyroid gland. Emphysema. Other: Carotid calcifications. IMPRESSION: 1.  No acute intracranial abnormality.  No skull fracture. 2. Unchanged degree of atrophy and chronic small vessel ischemia. 3. Mild degenerative change in the cervical spine without acute fracture or subluxation. Electronically Signed   By: Narda Rutherford M.D.   On: 01/06/2018 05:01   Ct Cervical Spine Wo Contrast  Result Date: 01/06/2018 CLINICAL DATA:  Fall out of bed striking head on wall. Subsequent second fall out of bed. EXAM: CT HEAD WITHOUT CONTRAST CT CERVICAL SPINE WITHOUT CONTRAST TECHNIQUE: Multidetector CT imaging of the head and cervical spine was performed following the standard protocol without intravenous contrast. Multiplanar CT image reconstructions of the cervical spine were also generated. COMPARISON:  Head CT 11/07/2016 FINDINGS: CT HEAD FINDINGS Brain: Unchanged degree of atrophy and moderate to severe chronic small vessel ischemia. Remote lacunar infarct in the right basal ganglia. No intracranial hemorrhage, mass effect, or midline shift. The basilar cisterns are patent. No evidence of territorial infarct or acute ischemia. No extra-axial or intracranial fluid collection. Vascular: Atherosclerosis of skullbase vasculature without hyperdense vessel or abnormal calcification. Skull: No fracture or focal lesion. Sinuses/Orbits: No acute finding. Other: None. CT CERVICAL SPINE FINDINGS Alignment: Trace anterolisthesis of C2  on C3. No traumatic subluxation. Skull base and vertebrae: No acute fracture. Non fusion posterior arch of C1, a normal variant. The dens and skull base are intact. Soft tissues and spinal canal: No prevertebral fluid or swelling. No visible canal hematoma. Disc levels: Mild for age disc space narrowing at C5-C6 and C6-C7 with endplate spurring. Upper chest: Heterogeneous right lobe of the thyroid gland. Emphysema. Other: Carotid calcifications. IMPRESSION: 1.  No acute intracranial abnormality.  No skull fracture. 2. Unchanged degree of atrophy and chronic small vessel ischemia. 3. Mild degenerative change in the cervical spine without acute fracture or subluxation. Electronically Signed   By: Narda Rutherford M.D.   On: 01/06/2018 05:01    ____________________________________________   PROCEDURES  Critical  Care performed: No   Procedure(s) performed:   Procedures   ____________________________________________   INITIAL IMPRESSION / ASSESSMENT AND PLAN / ED COURSE  As part of my medical decision making, I reviewed the following data within the electronic MEDICAL RECORD NUMBER Nursing notes reviewed and incorporated and Old chart reviewed    Differential diagnosis includes, but is not limited to, contusion, intracranial hemorrhage, fracture/dislocation.  The patient has had 2 falls tonight out of her bed but both appear to have been relatively low impact.  She has no external signs of acute trauma to her head and although she does have some old ecchymoses on her extremities she does not appear to have any new injuries on her extremities either.  She is having no respiratory distress and has reassuring vital signs.  CT scans of the head and cervical spine show no acute injuries.  She is appropriate for discharge back to her facility.  Clinical Course as of Jan 07 515  Fri Jan 06, 2018  1610 I verified that the patient did come with DNR paperwork which I entered in the computer and will return  with the patient's discharge instructions.   [CF]    Clinical Course User Index [CF] Loleta Rose, MD    ____________________________________________  FINAL CLINICAL IMPRESSION(S) / ED DIAGNOSES  Final diagnoses:  Fall, initial encounter     MEDICATIONS GIVEN DURING THIS VISIT:  Medications - No data to display   ED Discharge Orders    None       Note:  This document was prepared using Dragon voice recognition software and may include unintentional dictation errors.    Loleta Rose, MD 01/06/18 [id number]    Loleta Rose, MD 01/06/18 [phone]

## 2018-01-06 NOTE — ED Notes (Signed)
Awaiting transport back home.

## 2018-01-06 NOTE — ED Notes (Signed)
Pt noted to be wet with urine, bed changed, pt bathed, new gown placed as well as new depends and fall precaution alert pad placed under pt, will cont to monitor, awaiting transport back to facility

## 2018-01-06 NOTE — ED Notes (Signed)
Pt resting quietly, talking with son, no distress noted, cont to monitor

## 2018-01-06 NOTE — ED Notes (Signed)
Pt fed a few bites of applesauce without difficulty until she stated "no more", sips of water given as well, cont to monitor, awaiting transport

## 2018-01-06 NOTE — ED Triage Notes (Signed)
Pt brought in by [facility] from [facility] states she fell out of the bed and hit her head on the wall. They put her back on the bed and she fell again hitting head on the wall.

## 2018-01-06 NOTE — Discharge Instructions (Signed)

## 2018-05-22 IMAGING — CT CT HIP*R* W/O CM
3 series · 17 of 46 positions shown, 20 images · non-contrast
Comparison: None.

CLINICAL DATA: Status post fall.  Right hip pain.

EXAM:
CT OF THE RIGHT HIP WITHOUT CONTRAST
TECHNIQUE: Multidetector CT imaging of the right hip was performed according to
the standard protocol. Multiplanar CT image reconstructions were
also generated.

[Series 8: st 1.5 · axial · 0.38mm/px · z∈[-430,-288]mm · 13 of 106 slices shown, 16 images]
[im 7/106  soft-tissue]
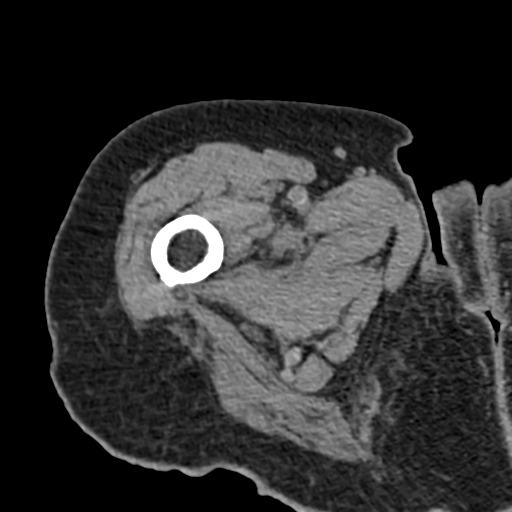
[im 7/106  bone]
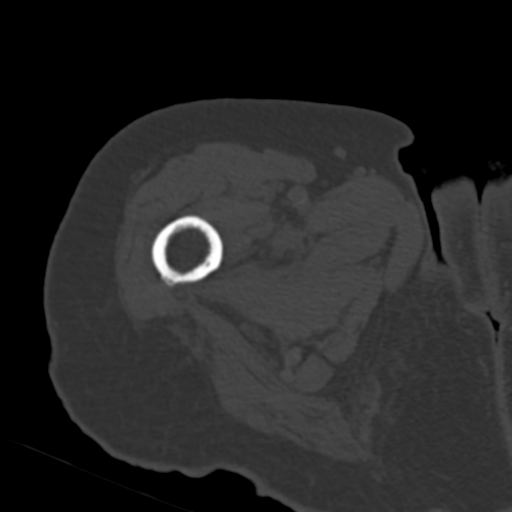
[im 17/106  soft-tissue]
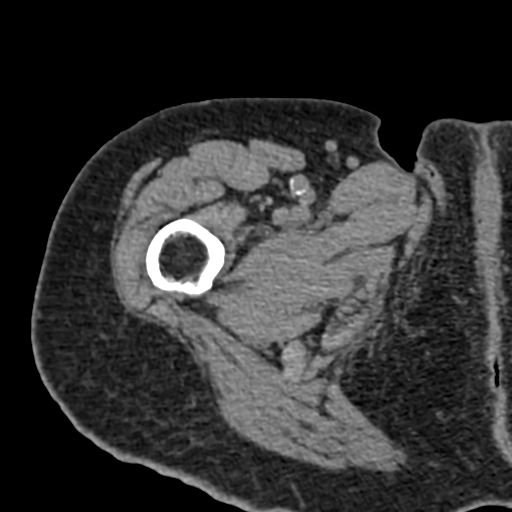
[im 28/106  soft-tissue]
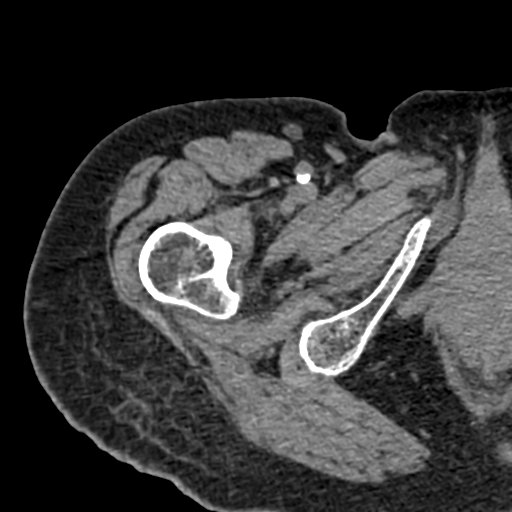
[im 38/106  soft-tissue]
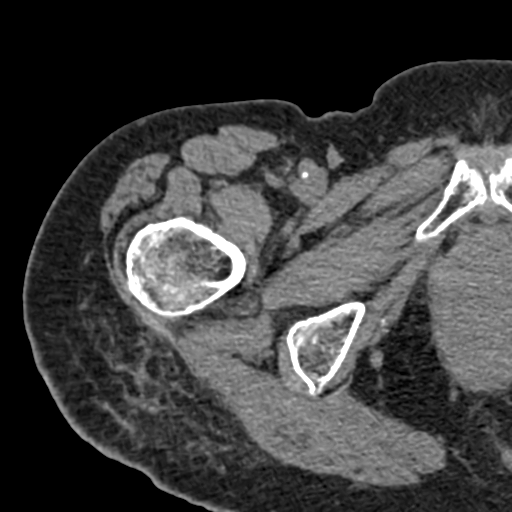
[im 48/106  soft-tissue]
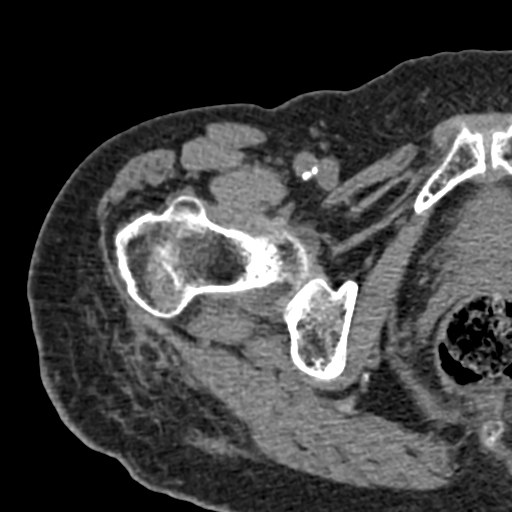
[im 58/106  soft-tissue]
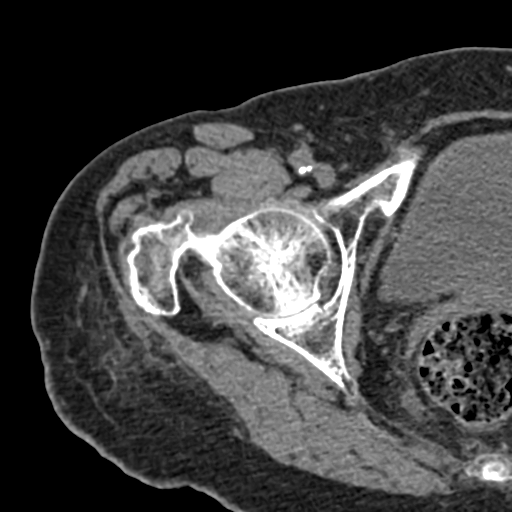
[im 68/106  soft-tissue]
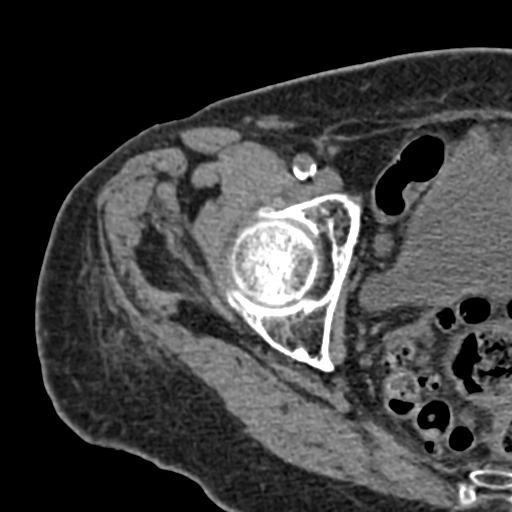
[im 78/106  soft-tissue]
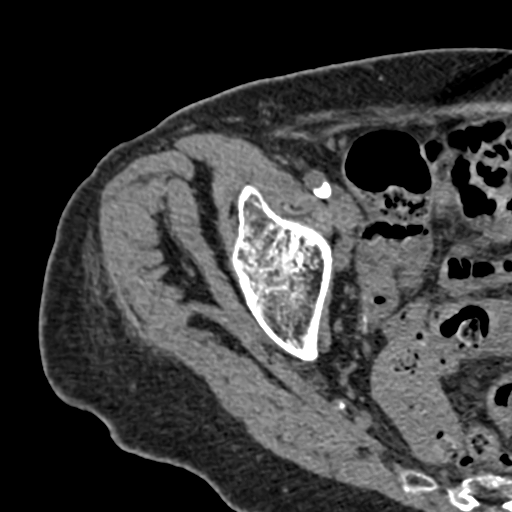
[im 89/106  soft-tissue]
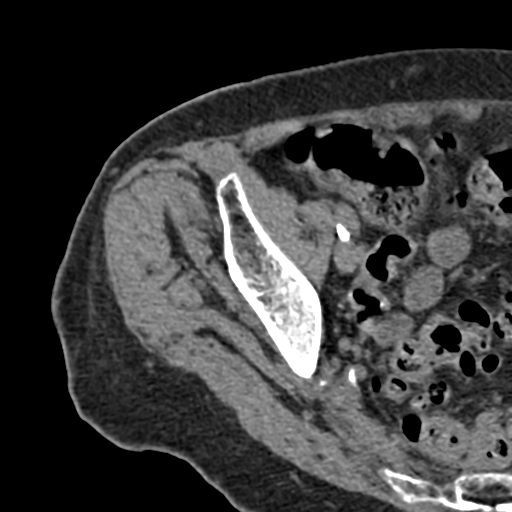
[im 89/106  bone]
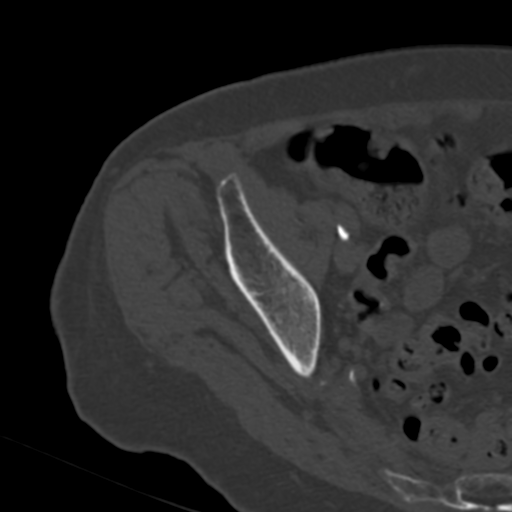
[im 92/106  lung]
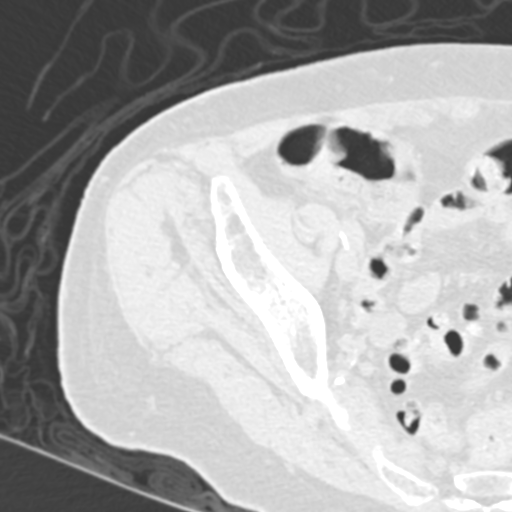
[im 95/106  lung]
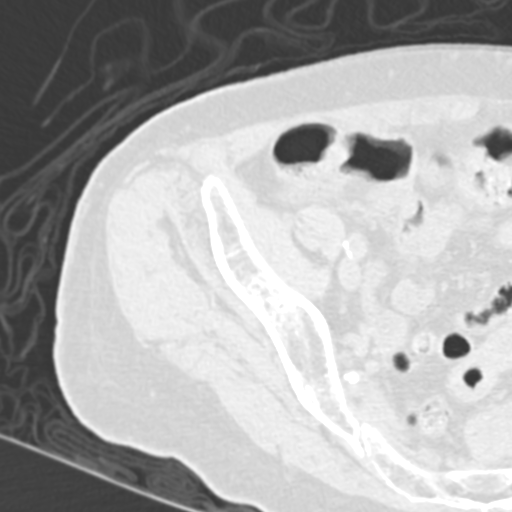
[im 99/106  soft-tissue]
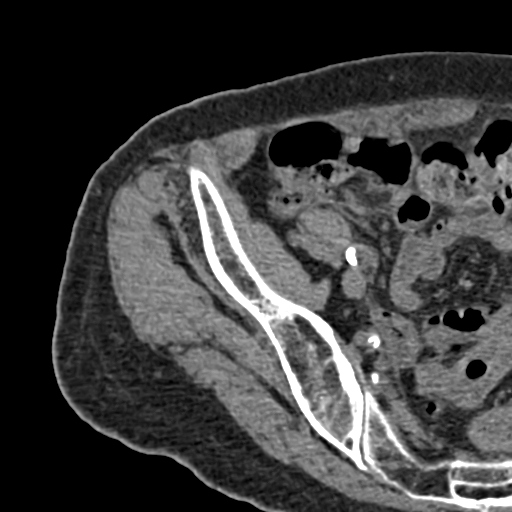
[im 99/106  lung]
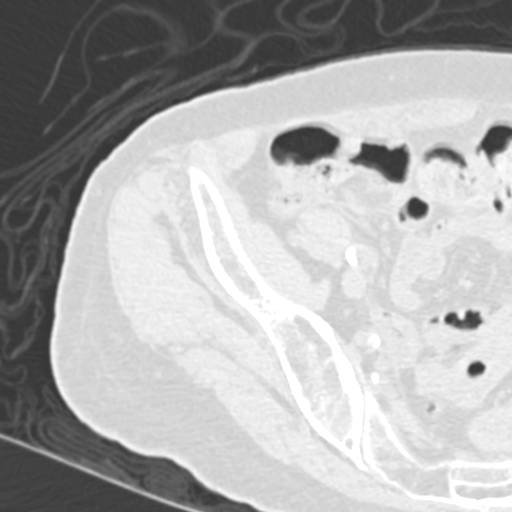
[im 102/106  lung]
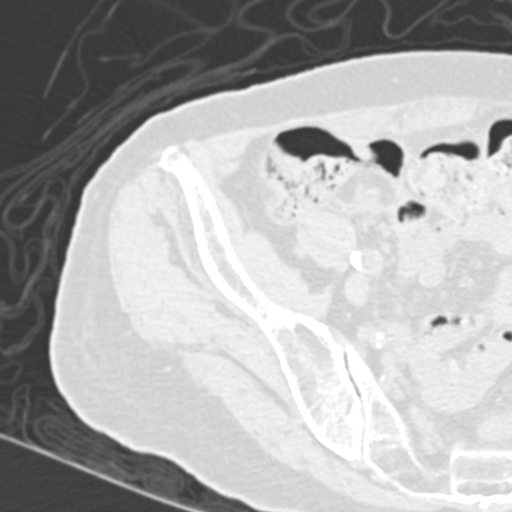

[Series 9: st bone · coronal · 0.38mm/px · 3 of 112 slices shown]
[im 38/112  soft-tissue]
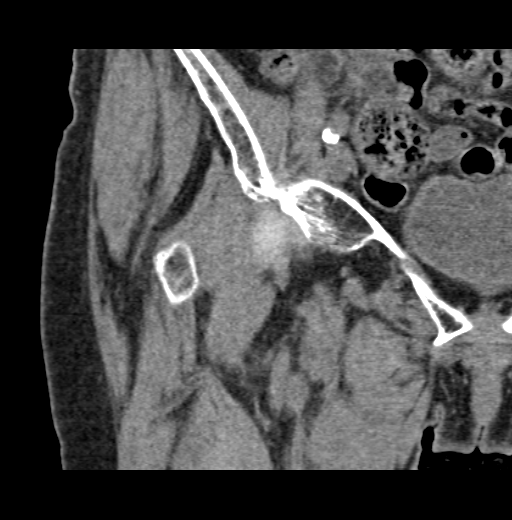
[im 50/112  soft-tissue]
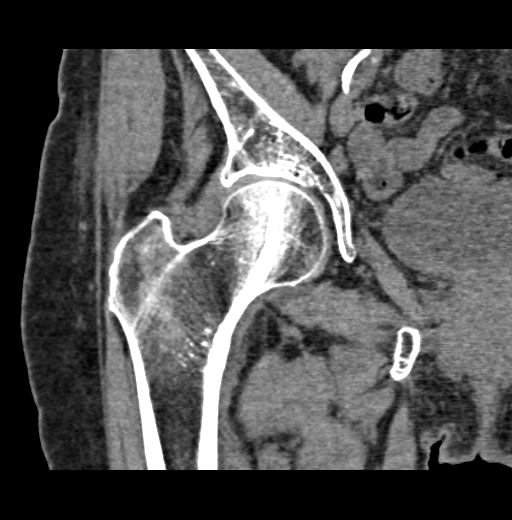
[im 62/112  soft-tissue]
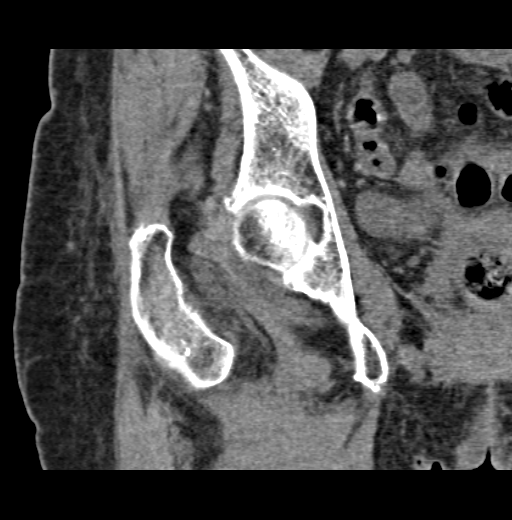

[Series 10: st sag · sagittal · 0.33mm/px · 1 of 122 slices shown]
[im 41/122  soft-tissue]
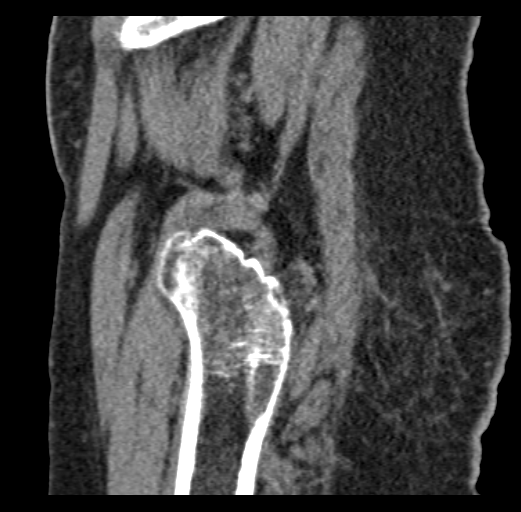

[17 of 46 positions shown; findings below may reference images not displayed]

FINDINGS: Bones/Joint/Cartilage

No aggressive lytic or sclerotic osseous lesion. Generalized
osteopenia.

No acute fracture or dislocation. Normal alignment. Mild
degenerative changes of the visualize right SI joint.

Muscles

Normal.

Soft tissue
No fluid collection or hematoma. No soft tissue mass. Soft tissue
contusion posteriorly overlying the right greater trochanter.

Peripheral vascular atherosclerotic disease.
IMPRESSION: 1.  No acute osseous injury of the right hip.

## 2018-07-05 DEATH — deceased
